# Patient Record
Sex: Male | Born: 1950 | Race: White | Hispanic: No | Marital: Married | Smoking: Former smoker
Health system: Southern US, Community
[De-identification: ages and names within clinical notes are randomized; demographics above are authoritative.]

## PROBLEM LIST (undated history)

## (undated) DIAGNOSIS — E785 Hyperlipidemia, unspecified: Secondary | ICD-10-CM

## (undated) DIAGNOSIS — C439 Malignant melanoma of skin, unspecified: Secondary | ICD-10-CM

## (undated) DIAGNOSIS — N201 Calculus of ureter: Secondary | ICD-10-CM

## (undated) DIAGNOSIS — M722 Plantar fascial fibromatosis: Secondary | ICD-10-CM

## (undated) HISTORY — DX: Hyperlipidemia, unspecified: E78.5

## (undated) HISTORY — DX: Plantar fascial fibromatosis: M72.2

## (undated) HISTORY — DX: Malignant melanoma of skin, unspecified: C43.9

## (undated) HISTORY — DX: Calculus of ureter: N20.1

---

## 1995-09-18 ENCOUNTER — Encounter: Payer: Self-pay | Admitting: Family Medicine

## 1995-09-18 LAB — CONVERTED CEMR LAB
Blood Glucose, Fasting: 90 mg/dL
RBC count: 5 10*6/uL
WBC, blood: 7.8 10*3/uL

## 1996-05-31 DIAGNOSIS — N201 Calculus of ureter: Secondary | ICD-10-CM

## 1996-05-31 HISTORY — DX: Calculus of ureter: N20.1

## 1998-03-10 ENCOUNTER — Encounter: Payer: Self-pay | Admitting: Family Medicine

## 1998-03-10 LAB — CONVERTED CEMR LAB: Blood Glucose, Fasting: 90 mg/dL

## 1999-07-29 ENCOUNTER — Encounter: Payer: Self-pay | Admitting: Family Medicine

## 1999-07-29 ENCOUNTER — Encounter: Admission: RE | Admit: 1999-07-29 | Discharge: 1999-07-29 | Payer: Self-pay | Admitting: Family Medicine

## 1999-07-30 ENCOUNTER — Encounter: Admission: RE | Admit: 1999-07-30 | Discharge: 1999-08-30 | Payer: Self-pay | Admitting: Family Medicine

## 2000-02-23 ENCOUNTER — Encounter: Payer: Self-pay | Admitting: Family Medicine

## 2000-02-23 LAB — CONVERTED CEMR LAB: Blood Glucose, Fasting: 98 mg/dL

## 2000-03-07 ENCOUNTER — Encounter: Payer: Self-pay | Admitting: Family Medicine

## 2000-03-07 LAB — CONVERTED CEMR LAB: Blood Glucose, Fasting: 77 mg/dL

## 2000-03-13 ENCOUNTER — Encounter: Payer: Self-pay | Admitting: Family Medicine

## 2000-03-13 LAB — CONVERTED CEMR LAB
RBC count: 5.2 10*6/uL
TSH: 1.51 microintl units/mL
WBC, blood: 8.3 10*3/uL

## 2003-08-18 ENCOUNTER — Encounter: Payer: Self-pay | Admitting: Family Medicine

## 2003-08-18 LAB — CONVERTED CEMR LAB
Blood Glucose, Fasting: 101 mg/dL
PSA: 0.8 ng/mL
TSH: 1.13 microintl units/mL

## 2003-10-08 LAB — HM COLONOSCOPY: HM Colonoscopy: NORMAL

## 2004-08-26 ENCOUNTER — Ambulatory Visit: Payer: Self-pay | Admitting: Family Medicine

## 2005-09-12 ENCOUNTER — Ambulatory Visit: Payer: Self-pay | Admitting: Family Medicine

## 2005-09-12 LAB — CONVERTED CEMR LAB
Blood Glucose, Fasting: 108 mg/dL
PSA: 0.72 ng/mL
TSH: 1.33 microintl units/mL

## 2005-09-14 ENCOUNTER — Ambulatory Visit: Payer: Self-pay | Admitting: Family Medicine

## 2005-10-06 ENCOUNTER — Ambulatory Visit: Payer: Self-pay | Admitting: Family Medicine

## 2007-03-28 ENCOUNTER — Encounter: Payer: Self-pay | Admitting: Family Medicine

## 2007-03-28 DIAGNOSIS — E781 Pure hyperglyceridemia: Secondary | ICD-10-CM

## 2007-03-28 DIAGNOSIS — R945 Abnormal results of liver function studies: Secondary | ICD-10-CM

## 2007-03-28 DIAGNOSIS — R7309 Other abnormal glucose: Secondary | ICD-10-CM | POA: Insufficient documentation

## 2007-03-29 ENCOUNTER — Ambulatory Visit: Payer: Self-pay | Admitting: Family Medicine

## 2007-03-29 LAB — CONVERTED CEMR LAB
ALT: 109 units/L — ABNORMAL HIGH (ref 0–53)
AST: 50 units/L — ABNORMAL HIGH (ref 0–37)
Bilirubin, Direct: 0.2 mg/dL (ref 0.0–0.3)
CO2: 32 meq/L (ref 19–32)
Calcium: 9.4 mg/dL (ref 8.4–10.5)
Chloride: 106 meq/L (ref 96–112)
Cholesterol: 178 mg/dL (ref 0–200)
Creatinine, Ser: 1.3 mg/dL (ref 0.4–1.5)
Creatinine,U: 127 mg/dL
Glucose, Bld: 100 mg/dL — ABNORMAL HIGH (ref 70–99)
HDL: 31.3 mg/dL — ABNORMAL LOW (ref >39.0)
LDL Cholesterol: 108 mg/dL — ABNORMAL HIGH (ref 0–99)
Sodium: 142 meq/L (ref 135–145)
TSH: 1.45 microintl units/mL (ref 0.35–5.50)
Total Bilirubin: 1.3 mg/dL — ABNORMAL HIGH (ref 0.3–1.2)
Total Protein: 6.8 g/dL (ref 6.0–8.3)

## 2007-04-04 ENCOUNTER — Ambulatory Visit: Payer: Self-pay | Admitting: Family Medicine

## 2007-05-01 ENCOUNTER — Ambulatory Visit: Payer: Self-pay | Admitting: Family Medicine

## 2007-05-01 LAB — CONVERTED CEMR LAB: OCCULT 3: NEGATIVE

## 2007-05-02 ENCOUNTER — Encounter (INDEPENDENT_AMBULATORY_CARE_PROVIDER_SITE_OTHER): Payer: Self-pay | Admitting: *Deleted

## 2007-08-15 ENCOUNTER — Telehealth: Payer: Self-pay | Admitting: Family Medicine

## 2008-02-01 DIAGNOSIS — C439 Malignant melanoma of skin, unspecified: Secondary | ICD-10-CM

## 2008-02-01 HISTORY — DX: Malignant melanoma of skin, unspecified: C43.9

## 2008-02-07 ENCOUNTER — Encounter: Payer: Self-pay | Admitting: Family Medicine

## 2008-02-07 HISTORY — PX: MELANOMA EXCISION: SHX5266

## 2008-04-04 ENCOUNTER — Ambulatory Visit: Payer: Self-pay | Admitting: Family Medicine

## 2008-04-06 LAB — CONVERTED CEMR LAB
Alkaline Phosphatase: 60 units/L (ref 39–117)
Basophils Absolute: 0 10*3/uL (ref 0.0–0.1)
Bilirubin, Direct: 0.1 mg/dL (ref 0.0–0.3)
CO2: 29 meq/L (ref 19–32)
Calcium: 9.5 mg/dL (ref 8.4–10.5)
Cholesterol: 175 mg/dL (ref 0–200)
Direct LDL: 105.6 mg/dL
GFR calc Af Amer: 89 mL/min
HDL: 31.4 mg/dL — ABNORMAL LOW (ref >39.0)
Hemoglobin: 16.8 g/dL (ref 13.0–17.0)
Lymphocytes Relative: 24.9 % (ref 12.0–46.0)
MCHC: 35.4 g/dL (ref 30.0–36.0)
Microalb Creat Ratio: 4.6 mg/g (ref 0.0–30.0)
Microalb, Ur: 0.8 mg/dL (ref 0.0–1.9)
Neutro Abs: 5.2 10*3/uL (ref 1.4–7.7)
PSA: 0.65 ng/mL (ref 0.10–4.00)
Platelets: 162 10*3/uL (ref 150–400)
Potassium: 4.5 meq/L (ref 3.5–5.1)
RDW: 12.4 % (ref 11.5–14.6)
Sodium: 144 meq/L (ref 135–145)
Total Bilirubin: 1.2 mg/dL (ref 0.3–1.2)
Total CHOL/HDL Ratio: 5.6
Triglycerides: 204 mg/dL (ref 0–149)
VLDL: 41 mg/dL — ABNORMAL HIGH (ref 0–40)

## 2008-04-10 ENCOUNTER — Ambulatory Visit: Payer: Self-pay | Admitting: Family Medicine

## 2008-07-17 ENCOUNTER — Encounter: Admission: RE | Admit: 2008-07-17 | Discharge: 2008-07-17 | Payer: Self-pay | Admitting: Occupational Medicine

## 2009-01-31 DIAGNOSIS — M722 Plantar fascial fibromatosis: Secondary | ICD-10-CM

## 2009-01-31 HISTORY — DX: Plantar fascial fibromatosis: M72.2

## 2009-04-07 ENCOUNTER — Ambulatory Visit: Payer: Self-pay | Admitting: Family Medicine

## 2009-04-07 LAB — CONVERTED CEMR LAB
ALT: 67 units/L — ABNORMAL HIGH (ref 0–53)
AST: 35 units/L (ref 0–37)
Albumin: 4.3 g/dL (ref 3.5–5.2)
Alkaline Phosphatase: 65 units/L (ref 39–117)
CO2: 30 meq/L (ref 19–32)
Chloride: 107 meq/L (ref 96–112)
Glucose, Bld: 100 mg/dL — ABNORMAL HIGH (ref 70–99)
Microalb Creat Ratio: 37.2 mg/g — ABNORMAL HIGH (ref 0.0–30.0)
Potassium: 4.5 meq/L (ref 3.5–5.1)
Sodium: 142 meq/L (ref 135–145)
Total Protein: 7.3 g/dL (ref 6.0–8.3)

## 2009-04-14 ENCOUNTER — Ambulatory Visit: Payer: Self-pay | Admitting: Family Medicine

## 2009-08-18 ENCOUNTER — Encounter: Payer: Self-pay | Admitting: Family Medicine

## 2009-09-08 ENCOUNTER — Encounter (INDEPENDENT_AMBULATORY_CARE_PROVIDER_SITE_OTHER): Payer: Self-pay | Admitting: *Deleted

## 2009-11-05 ENCOUNTER — Ambulatory Visit: Payer: Self-pay | Admitting: Family Medicine

## 2009-11-05 DIAGNOSIS — M542 Cervicalgia: Secondary | ICD-10-CM

## 2010-02-21 ENCOUNTER — Encounter: Payer: Self-pay | Admitting: General Surgery

## 2010-03-02 NOTE — Letter (Signed)
Summary: [facility]  [facility]   Imported By: Lanelle Bal 08/26/2009 09:17:45  _____________________________________________________________________  External Attachment:    Type:   Image     Comment:   External Document  Appended Document: [facility]    Clinical Lists Changes  Observations: Added new observation of PAST MED HX: L plantar fasciitis 2011 (08/26/2009 11:22)       Past History:  Past Medical History: L plantar fasciitis 2011

## 2010-03-02 NOTE — Assessment & Plan Note (Signed)
Summary: CPX/CLE   Vital Signs:  Patient profile:   60 year old male Height:      71 inches Weight:      228 pounds BMI:     31.91 Temp:     98.1 degrees F oral Pulse rate:   76 / minute Pulse rhythm:   regular BP sitting:   122 / 84  (left arm) Cuff size:   large  Vitals Entered By: Sydell Axon LPN (April 14, 2009 9:18 AM) CC: 30 Minute checkup, had a colonoscopy 09/05   History of Present Illness: Pt here for Comp Exam....retired last Jul and is enjoying it. He still coaches football at the high school.  He has no complaint. He had cold last week which is almost resolved. His wife teaches still and brought something home from  school. He has lost 6 pounds since last year.  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 * Tetanus  Past History:  Past Surgical History: Last updated: 04/10/2008 IVP DISTAL RIGHT URETRAL STONE :(05/1996) SCC EXCISION (DR JONES) (01/2000) ETT  WORKUP ST DOWN III :(03/2000) ABDOMINAL ULTRASOUND ; MILD INCREASED ECHODENSITY LIVER OTHERWISE  NORMAL:(08/29/2003) COLONOSCOPY --NORMAL:(DR.PATTERSON):(10/08/2003) -- REPEAT 10 YEARS MELANOMA MOHS EXCISION R ANT FLANK WITH NEG SENTINEL NODES [facility] 02/07/2008  Family History: Last updated: 04/14/2009 Father:A 84 Chol   Mother: A  84 Skin Cancers BROTHER A 61 CV: NEGATIVE HBP: NEGATIVE DM: NEGATIVE GOUT/ARTHRITIS: PROSTATE CANCER:  BREAST/OVARIAN/UTERINE CANCER: COLON CANCER: + UNCLE DEPRESSION: NEGATIVE ETOH/DRUG ABUSE: NEGATIVE OTHER: NEGATIVE STROKE  Social History: Last updated: 04/14/2009 Marital Status: Married  Lives with wife Children:3 DAUGHTERS  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[employer]  [employer]acks  Risk Factors: Alcohol Use: 0 (04/14/2009) Caffeine Use: coffee x 2 (04/14/2009) Exercise: yes (04/14/2009)  Risk Factors: Smoking Status: quit > 6 months (04/14/2009) Packs/Day: 1992 (04/14/2009) Passive Smoke Exposure: no (04/14/2009)  Family History: Father:A 84 Chol   Mother: A  84 Skin Cancers BROTHER A 61 CV: NEGATIVE HBP: NEGATIVE DM: NEGATIVE GOUT/ARTHRITIS: PROSTATE CANCER:  BREAST/OVARIAN/UTERINE CANCER: COLON CANCER: + UNCLE DEPRESSION: NEGATIVE ETOH/DRUG ABUSE: NEGATIVE OTHER: NEGATIVE STROKE  Social History: Marital Status: Married  Lives with wife Children:3 DAUGHTERS  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 Eyes:  Denies blurring, discharge, and eye pain. ENT:  Denies  decreased hearing, earache, and ringing in ears. CV:  Denies chest pain or discomfort, fainting, fatigue, palpitations, shortness of breath with exertion, swelling of feet, and swelling of hands. Resp:  Denies cough, shortness of breath, and wheezing. GI:  Denies abdominal pain, bloody stools, change in bowel habits, constipation, dark tarry stools, diarrhea,  indigestion, loss of appetite, nausea, vomiting, vomiting blood, and yellowish skin color. GU:  Denies discharge, dysuria, and urinary frequency. MS:  Complains of cramps; denies joint pain, joint redness, low back pain, muscle weakness, and stiffness; occas in summer. Derm:  Denies dryness, itching, and rash. Neuro:  Denies numbness, poor balance, tingling, and tremors.  Physical Exam  General:  Well-developed,well-nourished,in no acute distress; alert,appropriate and cooperative throughout examination. Minimally  obese. Head:  Normocephalic and atraumatic without obvious abnormalities. No apparent alopecia or balding. Eyes:  Conjunctiva clear bilaterally.  Ears:  External ear exam shows no significant lesions or deformities.  Otoscopic examination reveals clear canals, tympanic membranes are intact bilaterally without bulging, retraction, inflammation or discharge. Hearing is grossly normal bilaterally. Nose:  External nasal examination shows no deformity or inflammation. Nasal mucosa are pink and moist without lesions or exudates. Mouth:  Oral mucosa and oropharynx without lesions or exudates.  Teeth in good repair. Neck:  No deformities, masses, or tenderness noted. Chest Wall:  No deformities, masses, tenderness or gynecomastia noted. Breasts:  No masses or gynecomastia noted Lungs:  Normal respiratory effort, chest expands symmetrically. Lungs are clear to auscultation, no crackles or wheezes. Heart:  Normal rate and regular rhythm. S1 and S2 normal without gallop, murmur, click, rub or other extra sounds. Abdomen:  Bowel sounds positive,abdomen soft and non-tender without masses, organomegaly or hernias noted. Protuberant. Rectal:  No external abnormalities noted. Normal sphincter tone. No rectal masses or tenderness. G neg. Genitalia:  Testes bilaterally descended without nodularity, tenderness or masses. No scrotal masses or lesions. No penis lesions or urethral discharge. Prostate:   Prostate gland firm and smooth, no enlargement, nodularity, tenderness, mass, asymmetry or induration. 10-20gms. Msk:  No deformity or scoliosis noted of thoracic or lumbar spine.   Pulses:  R and L carotid,radial,femoral,dorsalis pedis and posterior tibial pulses are full and equal bilaterally Extremities:  No clubbing, cyanosis, edema, or deformity noted with normal full range of motion of all joints.   Neurologic:  No cranial nerve deficits noted. Station and gait are normal. Sensory, motor and coordinative functions appear intact. Skin:  Intact without suspicious lesions or rashes. Multip healed surg scars. Cervical Nodes:  No lymphadenopathy noted Inguinal Nodes:  No significant adenopathy Psych:  Cognition and judgment appear intact. Alert and cooperative with normal attention span and concentration. No apparent delusions, illusions, hallucinations   Impression & Recommendations:  Problem # 1:  HEALTH MAINTENANCE EXAM (ICD-V70.0) Assessment Comment Only  Problem # 2:  MELANOMA, RIGHT FLANK (ICD-172.9) Assessment: Unchanged Stable, see Derm every 6 mos.  Problem # 3:  HYPERGLYCEMIA (ICD-790.29) Assessment: Improved  Better than last year. Increased activity has helped. Cont.  Labs Reviewed: Creat: 1.2 (04/07/2009)     Problem # 4:  LIVER FUNCTION TESTS, ABNORMAL (ICD-794.8) Assessment: Improved  Only one slightly elevated this year. Chol control with exercise and weight loss has obviously helped.  Problem # 5:  HYPERTRIGLYCERIDEMIA, MILD (ICD-272.4) Assessment: Improved Cont to avoid sweets and carbs....the latter his problem. Labs Reviewed: SGOT: 35 (04/07/2009)   SGPT: 67 (04/07/2009)   HDL:40.50 (04/07/2009), 31.4 (04/04/2008)  LDL:DEL (04/04/2008), 108 (03/29/2007)  Chol:191 (04/07/2009), 175 (04/04/2008)  Trig:249.0 (04/07/2009),  204 (04/04/2008)  Patient Instructions: 1)  RTC one year.  Prior Medications (reviewed today): None Current Allergies (reviewed  today): ! * TETANUS

## 2010-03-02 NOTE — Letter (Signed)
Summary: Nadara Eaton letter  [place] [facility]  [address] [place], [place] [address]   Phone: [phone]  Fax: [phone]       09/08/2009 MRN: [id number]  [facility] [address] [place], [place]  [address]  Dear Mr. Thayer,  [facility] and [place] announce the retirement of Arta Silence, M.D., from full-time practice at the [facility] office effective July 30, 2009 and his plans of returning part-time.  It is important to Dr. Hetty Ely and to our practice that you understand that [facility] has seven physicians in our office for your health care needs.  We will continue to offer the same exceptional care that you have today.    Dr. Hetty Ely has spoken to many of you about his plans for retirement and returning part-time in the fall.   We will continue to work with you through the transition to schedule appointments for you in the office and meet the high standards that [facility] is committed to.   Again, it is with great pleasure that we share the news that Dr. Hetty Ely will return to [facility] in October of 2011 with a reduced schedule.    If you have any questions, or would like to request an appointment with one of our physicians, please call us at [phone] and press the option for Scheduling an appointment.  We take pleasure in providing you with excellent patient care and look forward to seeing you at your next office visit.  Our [facility] Physicians are:  Tillman Abide, M.D. Laurita Quint, M.D. Roxy Manns, M.D. Kerby Nora, M.D. Hannah Beat, M.D. Ruthe Mannan, M.D. We proudly welcomed Raechel Ache, M.D. and Eustaquio Boyden, M.D. to the practice in July/August 2011.  Sincerely,   [facility]

## 2010-03-02 NOTE — Assessment & Plan Note (Signed)
Summary: NECK PAIN/CLE   Vital Signs:  Patient profile:   60 year old male Height:      71 inches Weight:      228.0 pounds BMI:     31.91 Temp:     98.7 degrees F oral Pulse rate:   76 / minute Pulse rhythm:   regular BP sitting:   110 / 82  (left arm) Cuff size:   large  Vitals Entered By: Benny Lennert CMA Duncan Dull) (November 05, 2009 11:05 AM)  History of Present Illness: Chief complaint Neck pain  60 year old male:  about two weeks ago, felt a little pull a couple weeks ago  no prior nek prbs weekend  had his his twenty pound grandson.   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  She has used intermittent ibuprofen, has gotten somewhat better, though it aggravated a couple days he goes well. No numbness or tingling. No prior history of significant neck injury or operative intervention.   Nerve function, muscular strength is intact.  Allergies: 1)  ! * Tetanus  Past History:  Past medical, surgical, family and social histories (including risk factors) reviewed, and no changes noted (except as noted below).  Past Medical History: Reviewed history from 08/26/2009 and no changes required. L plantar fasciitis 2011  Past Surgical History: Reviewed history from 04/10/2008 and no changes required. IVP DISTAL RIGHT URETRAL STONE :(05/1996) SCC EXCISION (DR JONES) (01/2000) ETT  WORKUP ST DOWN III :(03/2000) ABDOMINAL ULTRASOUND ; MILD INCREASED ECHODENSITY LIVER OTHERWISE NORMAL:(08/29/2003) COLONOSCOPY --NORMAL:(DR.PATTERSON):(10/08/2003) -- REPEAT 10 YEARS MELANOMA MOHS EXCISION R ANT FLANK WITH NEG SENTINEL NODES [facility] 02/07/2008  Family History: Reviewed history from 04/14/2009 and no changes required. Father:A 84 Chol   Mother: A  84 Skin Cancers BROTHER A 61 CV: NEGATIVE HBP: NEGATIVE DM: NEGATIVE GOUT/ARTHRITIS: PROSTATE CANCER:  BREAST/OVARIAN/UTERINE CANCER: COLON CANCER: +  UNCLE DEPRESSION: NEGATIVE ETOH/DRUG ABUSE: NEGATIVE OTHER: NEGATIVE STROKE  Social History: Reviewed history from 04/14/2009 and no changes required. 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 Otherwise the pertinent positives of the ROS are noted above.    Physical Exam  General:  GEN: Well-developed,well-nourished,in no acute distress; alert,appropriate and cooperative throughout examination HEENT: Normocephalic and atraumatic without obvious abnormalities. No apparent alopecia or balding. Ears, externally no deformities PULM: Breathing comfortably in no respiratory distress EXT: No clubbing, cyanosis, or edema PSYCH: Normally interactive. Cooperative during the interview. Pleasant. Friendly and conversant. Not anxious or depressed appearing. Normal, full affect.  Msk:  nontender in the cervical spine, spinous process area.  Mild to moderate tenderness in the paracervical musculature.  Mildly limited in flexion and extension as well as lateral bending. Rotational motions are more painful and limited, notably  looking to the LEFT.  Negative Spurling's examination.  C5-T1 are intact.   Impression & Recommendations:  Problem # 1:  NECK PAIN, ACUTE (ICD-723.1) Assessment New most consistent with acute neck  strain.  Moist heat, massage, range of motion  in the shower,  NSAIDs, Zanaflex at night. Take some care during football practice to not reaggravate.  If not improving in 3-4 weeks, reconsideration of etiology appropriate.  His updated medication list for this problem includes:    Tizanidine Hcl 4 Mg Tabs  (Tizanidine hcl) .Marland Kitchen... 1 by mouth at bedtime    Diclofenac Sodium 75 Mg Tbec (Diclofenac sodium) .Marland Kitchen... 1 by mouth bid .take with food  Complete Medication List: 1)  Tizanidine Hcl 4 Mg Tabs (Tizanidine hcl) .Marland Kitchen.. 1 by mouth at bedtime 2)  Diclofenac Sodium 75 Mg Tbec (Diclofenac sodium) .Marland Kitchen.. 1 by mouth bid .take with food Prescriptions: TIZANIDINE HCL 4 MG TABS (TIZANIDINE HCL) 1 by mouth at bedtime  #30 x 2   Entered and Authorized by:   Hannah Beat MD   Signed by:   Hannah Beat MD on 11/05/2009   Method used:   Electronically to        [facility]  [address] [phone]* (retail)       [address]       [place], [place]  [address]       Ph: [phone]       Fax: [phone]   RxID:   [id number] DICLOFENAC SODIUM 75 MG TBEC (DICLOFENAC SODIUM) 1 by mouth bid .take with food  #60 x 1   Entered and Authorized by:   Hannah Beat MD   Signed by:   Hannah Beat MD on 11/05/2009   Method used:   [facility]        [facility]  [address] [phone]* (retail)       [address]       [place], [place]  [address]       Ph: [phone]       Fax: [phone]   RxID:   [id number] TIZANIDINE HCL 4 MG TABS (TIZANIDINE HCL) 1 by mouth at bedtime  #30 x 2   Entered and Authorized by:   Hannah Beat MD   Signed by:   Hannah Beat MD on 11/05/2009   Method used:   Print then Give to Patient   RxID:   [id number]   Prior Medications (reviewed today): None Current Allergies (reviewed today): ! * TETANUS

## 2010-03-06 ENCOUNTER — Encounter: Payer: Self-pay | Admitting: Family Medicine

## 2010-04-19 ENCOUNTER — Encounter: Payer: Self-pay | Admitting: *Deleted

## 2010-04-19 ENCOUNTER — Other Ambulatory Visit: Payer: Self-pay | Admitting: Family Medicine

## 2010-04-19 ENCOUNTER — Other Ambulatory Visit (INDEPENDENT_AMBULATORY_CARE_PROVIDER_SITE_OTHER): Payer: 59

## 2010-04-19 DIAGNOSIS — R7309 Other abnormal glucose: Secondary | ICD-10-CM

## 2010-04-19 DIAGNOSIS — Z0000 Encounter for general adult medical examination without abnormal findings: Secondary | ICD-10-CM

## 2010-04-19 DIAGNOSIS — R945 Abnormal results of liver function studies: Secondary | ICD-10-CM

## 2010-04-19 DIAGNOSIS — Z125 Encounter for screening for malignant neoplasm of prostate: Secondary | ICD-10-CM

## 2010-04-19 DIAGNOSIS — E785 Hyperlipidemia, unspecified: Secondary | ICD-10-CM

## 2010-04-19 LAB — BASIC METABOLIC PANEL
BUN: 20 mg/dL (ref 6–23)
CO2: 27 mEq/L (ref 19–32)
Chloride: 107 mEq/L (ref 96–112)
Glucose, Bld: 108 mg/dL — ABNORMAL HIGH (ref 70–99)
Potassium: 4.2 mEq/L (ref 3.5–5.1)

## 2010-04-19 LAB — LIPID PANEL
Cholesterol: 157 mg/dL (ref 0–200)
HDL: 35 mg/dL — ABNORMAL LOW (ref >39.00)
LDL Cholesterol: 87 mg/dL (ref 0–99)
Triglycerides: 174 mg/dL — ABNORMAL HIGH (ref 0.0–149.0)
VLDL: 34.8 mg/dL (ref 0.0–40.0)

## 2010-04-19 LAB — HEPATIC FUNCTION PANEL
ALT: 44 U/L (ref 0–53)
AST: 26 U/L (ref 0–37)
Albumin: 4.5 g/dL (ref 3.5–5.2)
Alkaline Phosphatase: 72 U/L (ref 39–117)

## 2010-04-19 LAB — TSH: TSH: 1.55 u[IU]/mL (ref 0.35–5.50)

## 2010-04-21 ENCOUNTER — Encounter: Payer: Self-pay | Admitting: Family Medicine

## 2010-04-21 ENCOUNTER — Ambulatory Visit (INDEPENDENT_AMBULATORY_CARE_PROVIDER_SITE_OTHER): Payer: 59 | Admitting: Family Medicine

## 2010-04-21 VITALS — BP 112/74 | HR 84 | Temp 98.1°F | Ht 71.0 in | Wt 206.0 lb

## 2010-04-21 DIAGNOSIS — R945 Abnormal results of liver function studies: Secondary | ICD-10-CM

## 2010-04-21 DIAGNOSIS — M542 Cervicalgia: Secondary | ICD-10-CM

## 2010-04-21 DIAGNOSIS — Z0000 Encounter for general adult medical examination without abnormal findings: Secondary | ICD-10-CM

## 2010-04-21 DIAGNOSIS — E785 Hyperlipidemia, unspecified: Secondary | ICD-10-CM

## 2010-04-21 DIAGNOSIS — R7309 Other abnormal glucose: Secondary | ICD-10-CM

## 2010-04-21 NOTE — Assessment & Plan Note (Addendum)
Still slightly elevated. Cont low carb diet and try to get to applying this 7 days a week.

## 2010-04-21 NOTE — Assessment & Plan Note (Signed)
Improved. Discussed approach for acute pain.

## 2010-04-21 NOTE — Assessment & Plan Note (Signed)
Lab Results  Component Value Date   CHOL 157 04/19/2010   CHOL 191 04/07/2009   CHOL 175 04/04/2008   Lab Results  Component Value Date   HDL 35.00* 04/19/2010   HDL 40.50 04/07/2009   HDL 31.4* 04/04/2008   Lab Results  Component Value Date   LDLCALC 87 04/19/2010   LDLCALC 108* 03/29/2007   Lab Results  Component Value Date   TRIG 174.0* 04/19/2010   TRIG 249.0* 04/07/2009   TRIG 204* 04/04/2008   Lab Results  Component Value Date   CHOLHDL 4 04/19/2010   CHOLHDL 5 04/07/2009   CHOLHDL 5.6 CALC 04/04/2008   Still slightly high but improved. Cont off meds.

## 2010-04-21 NOTE — Assessment & Plan Note (Signed)
Lab Results  Component Value Date   ALT 44 04/19/2010   AST 26 04/19/2010   ALKPHOS 72 04/19/2010   BILITOT 0.8 04/19/2010   Now normalized. Weight loss has greatly made a difference.

## 2010-04-21 NOTE — Progress Notes (Signed)
  Subjective:    Patient ID: Douglas Gaines, male    DOB: [birth year], 60 y.o.   MRN: [id number]  HPI Pt here for Comp Exam. He feels well and has no complaints. He had a crick in his neck a while back in his neck and uses ZANAFLEX  And still has some thzt he can use as needed.   Review of Systems  Constitutional: Negative for fever, chills, diaphoresis, activity change, appetite change, fatigue and unexpected weight change.  HENT: Negative for hearing loss, ear pain, nosebleeds, congestion, sneezing, trouble swallowing, neck pain, neck stiffness and tinnitus.   Eyes: Negative for pain, discharge and visual disturbance.  Respiratory: Negative for cough, chest tightness, shortness of breath and wheezing.   Cardiovascular: Negative for chest pain, palpitations and leg swelling.  Gastrointestinal: Negative for nausea, vomiting, abdominal pain, diarrhea, constipation, blood in stool and abdominal distention.  Genitourinary: Negative for dysuria and difficulty urinating.  Musculoskeletal: Negative for myalgias, back pain and joint swelling.  Skin: Negative for rash.  Neurological: Negative for dizziness, tremors, syncope, weakness and numbness.  Hematological: Negative for adenopathy. Bruises/bleeds easily.  Psychiatric/Behavioral: Negative for sleep disturbance, dysphoric mood and agitation.       Objective:   Physical Exam  Constitutional: He is oriented to person, place, and time. He appears well-developed and well-nourished. No distress.  HENT:  Head: Normocephalic and atraumatic.  Right Ear: External ear normal.  Left Ear: External ear normal.  Nose: Nose normal.  Mouth/Throat: Oropharynx is clear and moist.       Cerumen in left ear.  Eyes: Conjunctivae and EOM are normal. Pupils are equal, round, and reactive to light. Right eye exhibits no discharge. Left eye exhibits no discharge. No scleral icterus.  Neck: Normal range of motion. Neck supple. No thyromegaly present.    Cardiovascular: Normal rate, regular rhythm, normal heart sounds and intact distal pulses.   No murmur heard. Pulmonary/Chest: Effort normal and breath sounds normal. He has no wheezes.  Abdominal: Soft. Bowel sounds are normal. He exhibits no distension and no mass. There is no tenderness.  Genitourinary: Rectum normal, prostate normal and penis normal. Guaiac negative stool.  Musculoskeletal: Normal range of motion. He exhibits no edema.  Lymphadenopathy:    He has no cervical adenopathy.  Neurological: He is alert and oriented to person, place, and time.  Skin: Skin is warm and dry. No rash noted. He is not diaphoretic. No erythema.  Psychiatric: He has a normal mood and affect. His behavior is normal. Judgment and thought content normal.          Assessment & Plan:  Annual Physical Exam Discussed continued low carb diet and regular exercise. He watches carefully 4 days a week and loosens up the other three days and has lost 22 pounds doing this. His  Challenge is to continue the diet control to eventually extend to all 7 days of the week. Otherwise cont his current lifestyle.

## 2010-07-27 ENCOUNTER — Ambulatory Visit: Payer: 59 | Admitting: Family Medicine

## 2010-07-27 ENCOUNTER — Encounter (INDEPENDENT_AMBULATORY_CARE_PROVIDER_SITE_OTHER): Payer: Self-pay | Admitting: General Surgery

## 2010-07-27 ENCOUNTER — Telehealth: Payer: Self-pay | Admitting: *Deleted

## 2010-07-27 NOTE — Telephone Encounter (Signed)
Patient advised.

## 2010-07-27 NOTE — Telephone Encounter (Signed)
Noted.  If he is SOB, having CP, or feeling faint, the he needs to be at ER in meantime.  Please notify pt.  Thanks.

## 2010-07-27 NOTE — Telephone Encounter (Signed)
Patient was on a cruise all last week and on Thursday he got a hemorrhoid and he says that it is about the size of a golf ball. He was scheduled to see a surgeon today, but they canceled his appt.  He says that he is losing a lot of blood with bowel movement and when he wipes. He says that he has been really light headed on and off since a couple of days ago, but he isn't sure if it is from the blood loss or if  it is possibly from being on the cruise. He was asking if he could come in for a cbc, but per Dr. Sharen Hones he would need to be seen first. We have no openings here today with anyone, so I advised Urgent Care. Patient says that he already has an appt with the surgeon for tomorrow, but also scheduled appt to see you tomorrow in case the surgeon cancels again. He will cancel appt here if it is not needed.

## 2010-07-28 ENCOUNTER — Ambulatory Visit: Payer: 59 | Admitting: Family Medicine

## 2010-07-28 ENCOUNTER — Ambulatory Visit (INDEPENDENT_AMBULATORY_CARE_PROVIDER_SITE_OTHER): Payer: 59 | Admitting: General Surgery

## 2010-07-28 ENCOUNTER — Encounter (INDEPENDENT_AMBULATORY_CARE_PROVIDER_SITE_OTHER): Payer: Self-pay | Admitting: General Surgery

## 2010-07-28 VITALS — BP 124/78 | HR 104 | Temp 97.8°F | Wt 195.0 lb

## 2010-07-28 DIAGNOSIS — K645 Perianal venous thrombosis: Secondary | ICD-10-CM | POA: Insufficient documentation

## 2010-07-28 MED ORDER — OXYCODONE-ACETAMINOPHEN 5-325 MG PO TABS: 1.0000 | ORAL_TABLET | ORAL | Status: DC | PRN

## 2010-07-28 NOTE — Assessment & Plan Note (Signed)
The thrombosed hemorrhoid was incised and evacuated as described above. The patient was advised to take it easy for the next several days. He was given a prescription for 20 tablets of Percocet. He is scheduled to go to [place] in the next week. He is advised that he will need to take pain medication for the trip most likely. He was advised that he should do sitz baths twice a day. He has whirlpool tub at home.

## 2010-07-28 NOTE — Progress Notes (Signed)
Subjective:     Patient ID: Douglas Gaines, male   DOB: 1950-12-25, 60 y.o.   MRN: [id number]    BP 124/78  Pulse 104  Temp 97.8 F (36.6 C)  Wt 195 lb (88.451 kg)    HPI Patient is a 60 year old male who presents with around 6 days of perirectal pain. He was on a cruise last week and experienced swelling and severe discomfort. He went to see the cruise doctor and was informed that he had a thrombosed hemorrhoid. He did not want down ago and incision on it or in [place] as he wanted to wait until he returned home. He has been placing hemorrhoid cream on the swelling. He has not been doing for the past. He has been taking stool softeners. He has not been straining to have bowel movements that has been having softer stools now. He has experienced some drainage from the hemorrhoid over the last few days. He says he has had occasional hemorrhoids on-and-off in the past but these are such that he has only experienced discomfort for around a day or 2.   Review of Systems Review of systems is otherwise negative    Objective:   Physical Exam General: Alert and oriented x3 no acute distress HEENT: Normocephalic atraumatic sclerae anicteric neck no lymphadenopathy no thyromegaly. Psychiatry: Mood and affect are normal Gait is normal no significant musculoskeletal deformities Rectal: There is a large thrombosed hemorrhoid on the right with a few small openings and clotted blood coming out. This was around 3 cm in diameter.  Procedure The hemorrhoid was cleaned with a Betadine swab. Local anesthetic in the form of 1% lidocaine and epinephrine and bicarbonate solution were injected in the area around the hemorrhoid. Once the area was anesthetized the 2 openings were connected with a #11 blade. Copious amounts of clot were expressed. There was no significant bleeding from the wound. The wound was dressed with dry gauze.    Assessment:        Plan:

## 2010-08-27 ENCOUNTER — Ambulatory Visit (INDEPENDENT_AMBULATORY_CARE_PROVIDER_SITE_OTHER): Payer: 59 | Admitting: General Surgery

## 2010-08-27 VITALS — Temp 97.8°F

## 2010-08-27 DIAGNOSIS — K645 Perianal venous thrombosis: Secondary | ICD-10-CM

## 2010-08-27 NOTE — Assessment & Plan Note (Signed)
Doing better Given hemorrhoid booklet. Follow up if symptoms recur.

## 2010-08-27 NOTE — Progress Notes (Signed)
Douglas Gaines is a 60 y.o. male.    Chief Complaint  Patient presents with  . Follow-up    throm hem    HPI HPI Pt is feeling much better since the I&D of thrombosed external hemorrhoid.  He is not experiencing pain or difficulty with cleaning.  He denies bleeding.  His stools are soft.  He has been doing sitz baths as needed.   Past Medical History  Diagnosis Date  . Plantar fasciitis 2011    left  . Ureteral stone 05/98    IVP Distal Right    Past Surgical History  Procedure Date  . Melanoma excision 02/07/08    [facility] Right Ant Flank with Neg Sentinel Nodes [facility]    Family History  Problem Relation Age of Onset  . Cancer Mother     Skin  . Hyperlipidemia Father     Social History History  Substance Use Topics  . Smoking status: Former Games developer  . Smokeless tobacco: Never Used  . Alcohol Use: No    Allergies  Allergen Reactions  . Tetanus Toxoid     REACTION: UNSPECIFIED  did ok with half doses.    Current Outpatient Prescriptions  Medication Sig Dispense Refill  . oxyCODONE-acetaminophen (ROXICET) 5-325 MG per tablet Take 1 tablet by mouth every 4 (four) hours as needed for pain.  20 tablet  0  . tiZANidine (ZANAFLEX) 4 MG tablet Take 4 mg by mouth at bedtime.          Review of Systems Review of Systems    Physical Exam Physical Exam  Perianal exam demonstrates some external hemorrhoids.  No acute thrombosis.  Improving appearance. No bleeding or irritation.  Temperature 97.8 F (36.6 C).  Assessment/Plan Thrombosed external hemorrhoid Doing better Given hemorrhoid booklet. Follow up if symptoms recur.        Akeem Heppler 08/27/2010, 12:18 PM

## 2010-09-27 ENCOUNTER — Other Ambulatory Visit (INDEPENDENT_AMBULATORY_CARE_PROVIDER_SITE_OTHER): Payer: Self-pay

## 2010-09-27 NOTE — Telephone Encounter (Signed)
Pt called requested an rx for his hemorrhoids, spoke with Dr. Donell Beers she ordered Anusol HC 2% cream, called in to [facility] [phone]

## 2011-03-16 ENCOUNTER — Telehealth: Payer: Self-pay | Admitting: Family Medicine

## 2011-03-16 NOTE — Telephone Encounter (Signed)
Pt is on schedule for tomorrow

## 2011-03-16 NOTE — Telephone Encounter (Signed)
Triage Record Num: [id number] Operator: Aundra Millet Patient Name: Douglas Gaines Call Date & Time: 03/16/2011 8:41:23AM Patient Phone: [phone] PCP: Crawford Givens Patient Gender: Male PCP Fax : Patient DOB: 1950-09-03 Practice Name: [facility] Reason for Call: Caller: [name]/Patient; PCP: Crawford Givens [name]); CB#: [phone]; ; ; Call regarding Flu Like Symptoms; Onset sore throat, cough, congestion on 03/07/2011 - Sx's seemed to improve over the weekend but since last night sore throat , nasal congestion , cough returned. No fever. Cough is more bothersome at night and has to elevate head. Feels like has drainage back of throat. RN reached See in 24 hrs for cough and having to sleep with head of bed elevated per Cough protocol - No open EPIC appts today. RN called office and spoke with "Aram Beecham" who will call pt back about workin appt and pt advised Protocol(s) Used: Cough - Adult Recommended Outcome per Protocol: See Provider within 24 hours Reason for Outcome: Gradual onset of cough when lying down AND having to sleep on more than one pillow or with head of bed elevated Care Advice: ~ Avoid any activity that produces symptoms until evaluated by provider. ~ Sleep with head elevated on several pillows or in a recliner. Provide for rest periods by spacing activities that require physical exertion. Promote calm, restful environment; anxiety increases breathing difficulty. ~ ~ SYMPTOM / CONDITION MANAGEMENT ~ Call EMS 911 if having chest pain lasting more than 5 minutes, fainting, or rapid or irregular heart beat. 03/16/2011 8:58:16AM Page 1 of 1 [email or web address]

## 2011-03-17 ENCOUNTER — Ambulatory Visit (INDEPENDENT_AMBULATORY_CARE_PROVIDER_SITE_OTHER): Payer: 59 | Admitting: Family Medicine

## 2011-03-17 ENCOUNTER — Encounter: Payer: Self-pay | Admitting: Family Medicine

## 2011-03-17 VITALS — BP 102/60 | HR 74 | Temp 97.9°F | Wt 203.8 lb

## 2011-03-17 DIAGNOSIS — J029 Acute pharyngitis, unspecified: Secondary | ICD-10-CM

## 2011-03-17 DIAGNOSIS — J329 Chronic sinusitis, unspecified: Secondary | ICD-10-CM

## 2011-03-17 MED ORDER — AMOXICILLIN 875 MG PO TABS: 875.0000 mg | ORAL_TABLET | Freq: Two times a day (BID) | ORAL | Status: AC

## 2011-03-17 NOTE — Progress Notes (Signed)
duration of symptoms: 10 days Rhinorrhea: yes Congestion: yes ear pain: no, but stuffy/pressure sore throat: yes Cough: yes Myalgias: no other concerns: fever at night Was feeling better until early this week and then he got worse, now with discolored and blood rhinorrhea RST neg.  Had a flu shot.   ROS: See HPI.  Otherwise negative.    Meds, vitals, and allergies reviewed.   GEN: nad, alert and oriented HEENT: mucous membranes moist, TM w/o erythema, nasal epithelium injected, OP with cobblestoning, max sinus ttp x2, purulent discharge from nares NECK: supple w/o LA CV: rrr. PULM: ctab, no inc wob EXT: no edema

## 2011-03-17 NOTE — Patient Instructions (Signed)
Drink plenty of fluids, take tylenol as needed, and use nasal saline.  Start the antibiotics today.  This should gradually improve.  Take care.  Let us know if you have other concerns.

## 2011-03-20 DIAGNOSIS — J329 Chronic sinusitis, unspecified: Secondary | ICD-10-CM | POA: Insufficient documentation

## 2011-03-20 NOTE — Assessment & Plan Note (Signed)
Nontoxic, start amoxil given the exam and duration.  Supportive tx and fu prn.  He agrees.

## 2011-04-12 ENCOUNTER — Other Ambulatory Visit: Payer: Self-pay | Admitting: Family Medicine

## 2011-04-12 DIAGNOSIS — E781 Pure hyperglyceridemia: Secondary | ICD-10-CM

## 2011-04-15 ENCOUNTER — Other Ambulatory Visit (INDEPENDENT_AMBULATORY_CARE_PROVIDER_SITE_OTHER): Payer: 59

## 2011-04-15 DIAGNOSIS — E781 Pure hyperglyceridemia: Secondary | ICD-10-CM

## 2011-04-15 LAB — LIPID PANEL
LDL Cholesterol: 99 mg/dL (ref 0–99)
Total CHOL/HDL Ratio: 4

## 2011-04-15 LAB — COMPREHENSIVE METABOLIC PANEL
ALT: 35 U/L (ref 0–53)
AST: 25 U/L (ref 0–37)
Albumin: 4.1 g/dL (ref 3.5–5.2)
CO2: 28 mEq/L (ref 19–32)
Calcium: 9.3 mg/dL (ref 8.4–10.5)
Chloride: 106 mEq/L (ref 96–112)
Potassium: 4.6 mEq/L (ref 3.5–5.1)
Total Protein: 7.3 g/dL (ref 6.0–8.3)

## 2011-04-22 ENCOUNTER — Encounter: Payer: Self-pay | Admitting: Family Medicine

## 2011-04-22 ENCOUNTER — Ambulatory Visit (INDEPENDENT_AMBULATORY_CARE_PROVIDER_SITE_OTHER): Payer: 59 | Admitting: Family Medicine

## 2011-04-22 VITALS — BP 118/62 | HR 54 | Temp 97.4°F | Wt 201.0 lb

## 2011-04-22 DIAGNOSIS — R7309 Other abnormal glucose: Secondary | ICD-10-CM

## 2011-04-22 DIAGNOSIS — Z7189 Other specified counseling: Secondary | ICD-10-CM

## 2011-04-22 DIAGNOSIS — R945 Abnormal results of liver function studies: Secondary | ICD-10-CM

## 2011-04-22 DIAGNOSIS — Z0000 Encounter for general adult medical examination without abnormal findings: Secondary | ICD-10-CM

## 2011-04-22 NOTE — Progress Notes (Signed)
CPE- See plan.  Routine anticipatory guidance given to patient.  See health maintenance. Feeling well.   No FH prostate or colon cancer.    PMH and SH reviewed  Meds, vitals, and allergies reviewed.   ROS: See HPI.  Otherwise negative.    GEN: nad, alert and oriented HEENT: mucous membranes moist NECK: supple w/o LA CV: rrr. PULM: ctab, no inc wob ABD: soft, +bs EXT: no edema SKIN: no acute rash Prostate gland firm and smooth, no enlargement, nodularity, tenderness, mass, asymmetry or induration. Stool heme neg.

## 2011-04-22 NOTE — Patient Instructions (Addendum)
Take care.  Keep exercising.  I would get a flu shot each fall.  Recheck labs next year.  Glad to see you.

## 2011-04-22 NOTE — Assessment & Plan Note (Signed)
H/o fatty liver on u/s prev, LFTs normalized with weight loss.

## 2011-04-25 DIAGNOSIS — Z7189 Other specified counseling: Secondary | ICD-10-CM | POA: Insufficient documentation

## 2011-04-25 DIAGNOSIS — Z0000 Encounter for general adult medical examination without abnormal findings: Secondary | ICD-10-CM | POA: Insufficient documentation

## 2011-04-25 NOTE — Assessment & Plan Note (Signed)
Mild, d/w pt, continue diet and exercise.

## 2011-04-25 NOTE — Assessment & Plan Note (Signed)
Flu prev done. I wouldn't get tetanus given his prev reaction PNA and shingles shot in 60s.  Colon up to date, heme neg today PSA options were discussed along with recent recs.  No indication for psa at this point, since patient is low risk and there is no FH of prosate CA.  He declined testing of PSA. Healthy habits encouraged.

## 2012-04-11 ENCOUNTER — Other Ambulatory Visit: Payer: Self-pay | Admitting: Family Medicine

## 2012-04-11 DIAGNOSIS — R739 Hyperglycemia, unspecified: Secondary | ICD-10-CM

## 2012-04-16 ENCOUNTER — Other Ambulatory Visit (INDEPENDENT_AMBULATORY_CARE_PROVIDER_SITE_OTHER): Payer: 59

## 2012-04-16 DIAGNOSIS — R7309 Other abnormal glucose: Secondary | ICD-10-CM

## 2012-04-16 DIAGNOSIS — R739 Hyperglycemia, unspecified: Secondary | ICD-10-CM

## 2012-04-16 LAB — COMPREHENSIVE METABOLIC PANEL
ALT: 45 U/L (ref 0–53)
Albumin: 4.1 g/dL (ref 3.5–5.2)
CO2: 26 mEq/L (ref 19–32)
Calcium: 8.9 mg/dL (ref 8.4–10.5)
Chloride: 106 mEq/L (ref 96–112)
GFR: 70.44 mL/min (ref >60.00)
Potassium: 4.2 mEq/L (ref 3.5–5.1)
Sodium: 139 mEq/L (ref 135–145)
Total Bilirubin: 0.6 mg/dL (ref 0.3–1.2)
Total Protein: 6.7 g/dL (ref 6.0–8.3)

## 2012-04-16 LAB — LIPID PANEL: Total CHOL/HDL Ratio: 5

## 2012-04-23 ENCOUNTER — Ambulatory Visit (INDEPENDENT_AMBULATORY_CARE_PROVIDER_SITE_OTHER): Payer: 59 | Admitting: Family Medicine

## 2012-04-23 ENCOUNTER — Encounter: Payer: Self-pay | Admitting: Family Medicine

## 2012-04-23 VITALS — BP 122/68 | HR 64 | Temp 98.1°F | Ht 71.0 in | Wt 210.0 lb

## 2012-04-23 DIAGNOSIS — R7309 Other abnormal glucose: Secondary | ICD-10-CM

## 2012-04-23 DIAGNOSIS — Z0000 Encounter for general adult medical examination without abnormal findings: Secondary | ICD-10-CM

## 2012-04-23 NOTE — Progress Notes (Signed)
CPE- See plan.  Routine anticipatory guidance given to patient.  See health maintenance. Tetanus shot not indicated, discussed.   Flu shot done at work, yearly. Shingles shot d/w pt.  Due at 62.   Colonoscopy done 2005. Not due until 2015.  Prostate cancer screening and PSA options (with potential risks and benefits of testing vs not testing) were discussed along with recent recs/guidelines.  He declined testing PSA at this point.   Living will d/w pt. Wife would be designated if incapacitated.  He would want his children involved in decisions.   Diet and exercise d/w pt.  Doing well with both.   His brother was dx'd with cancer and the pt is trying to work through this.    PMH and SH reviewed  Meds, vitals, and allergies reviewed.   ROS: See HPI.  Otherwise negative.    GEN: nad, alert and oriented HEENT: mucous membranes moist NECK: supple w/o LA CV: rrr. PULM: ctab, no inc wob ABD: soft, +bs EXT: no edema SKIN: no acute rash

## 2012-04-23 NOTE — Assessment & Plan Note (Signed)
D/w pt about diet and exercise.  Recheck periodically.

## 2012-04-23 NOTE — Assessment & Plan Note (Signed)
Routine anticipatory guidance given to patient.  See health maintenance. Tetanus shot not indicated, discussed.   Flu shot done at work, yearly. Shingles shot d/w pt.  Due at 62.   Colonoscopy done 2005. Not due until 2015.  Prostate cancer screening and PSA options (with potential risks and benefits of testing vs not testing) were discussed along with recent recs/guidelines.  He declined testing PSA at this point.   Living will d/w pt. Wife would be designated if incapacitated.  He would want his children involved in decisions.   Diet and exercise d/w pt.  Doing well with both.

## 2012-04-23 NOTE — Patient Instructions (Addendum)
Check with your insurance to see if they will cover the shingles shot when you turn 62. Take care.  Keep exercising and stay away from sweets.   Call or come back about concerns.  Glad to see you.  I would get a flu shot each fall.

## 2013-03-11 ENCOUNTER — Ambulatory Visit (INDEPENDENT_AMBULATORY_CARE_PROVIDER_SITE_OTHER): Payer: 59 | Admitting: Family Medicine

## 2013-03-11 ENCOUNTER — Encounter: Payer: Self-pay | Admitting: Family Medicine

## 2013-03-11 VITALS — BP 122/82 | HR 84 | Temp 97.6°F | Wt 215.8 lb

## 2013-03-11 DIAGNOSIS — L989 Disorder of the skin and subcutaneous tissue, unspecified: Secondary | ICD-10-CM

## 2013-03-11 NOTE — Patient Instructions (Signed)
Let me call the derm clinic and we'll go from there.

## 2013-03-11 NOTE — Progress Notes (Signed)
Pre-visit discussion using our clinic review tool. No additional management support is needed unless otherwise documented below in the visit note.  Lump near the R axilla.  Not sore.  Known to be present for about 3 days.  Unknown of present longer than that.  No other new lumps.  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 No FCNAV. He feels well.   H/o melanoma 5 years ago.  Was released by melanoma clinic last year.  Still sees derm regularly Wilhemina Bonito).  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   Meds, vitals, and allergies reviewed.   ROS: See HPI.  Otherwise, noncontributory.  ncat Neck supple, no LA in the neck or either axilla Small nontender mass <1cm near the R triceps, not in the axilla.

## 2013-03-12 ENCOUNTER — Telehealth: Payer: Self-pay | Admitting: Family Medicine

## 2013-03-12 DIAGNOSIS — L989 Disorder of the skin and subcutaneous tissue, unspecified: Secondary | ICD-10-CM | POA: Insufficient documentation

## 2013-03-12 NOTE — Telephone Encounter (Signed)
Call pt.  Talked to Dr. Ronnald Ramp.  He'll check the spot on his arm.  Have pt call derm and ask for Janett Billow to get scheduled.  I thanked Dr. Ronnald Ramp for his help.

## 2013-03-12 NOTE — Assessment & Plan Note (Signed)
Likely benign, given hx will d/w derm and notify pt.  Pt agrees.

## 2013-03-13 NOTE — Telephone Encounter (Signed)
Patient notified as instructed by telephone. 

## 2013-04-13 ENCOUNTER — Other Ambulatory Visit: Payer: Self-pay | Admitting: Family Medicine

## 2013-04-13 DIAGNOSIS — E785 Hyperlipidemia, unspecified: Secondary | ICD-10-CM

## 2013-04-18 ENCOUNTER — Other Ambulatory Visit (INDEPENDENT_AMBULATORY_CARE_PROVIDER_SITE_OTHER): Payer: 59

## 2013-04-18 DIAGNOSIS — E785 Hyperlipidemia, unspecified: Secondary | ICD-10-CM

## 2013-04-18 LAB — COMPREHENSIVE METABOLIC PANEL
ALBUMIN: 4.5 g/dL (ref 3.5–5.2)
ALT: 56 U/L — ABNORMAL HIGH (ref 0–53)
AST: 32 U/L (ref 0–37)
Alkaline Phosphatase: 66 U/L (ref 39–117)
BUN: 18 mg/dL (ref 6–23)
CALCIUM: 8.9 mg/dL (ref 8.4–10.5)
CHLORIDE: 107 meq/L (ref 96–112)
CO2: 26 meq/L (ref 19–32)
Creatinine, Ser: 1.3 mg/dL (ref 0.4–1.5)
GFR: 61.91 mL/min (ref >60.00)
GLUCOSE: 97 mg/dL (ref 70–99)
Potassium: 4.3 mEq/L (ref 3.5–5.1)
SODIUM: 141 meq/L (ref 135–145)
TOTAL PROTEIN: 7.1 g/dL (ref 6.0–8.3)
Total Bilirubin: 0.9 mg/dL (ref 0.3–1.2)

## 2013-04-18 LAB — LIPID PANEL
Cholesterol: 176 mg/dL (ref 0–200)
HDL: 34.5 mg/dL — AB (ref >39.00)
LDL Cholesterol: 100 mg/dL — ABNORMAL HIGH (ref 0–99)
Total CHOL/HDL Ratio: 5
Triglycerides: 210 mg/dL — ABNORMAL HIGH (ref 0.0–149.0)
VLDL: 42 mg/dL — ABNORMAL HIGH (ref 0.0–40.0)

## 2013-04-25 ENCOUNTER — Ambulatory Visit (INDEPENDENT_AMBULATORY_CARE_PROVIDER_SITE_OTHER): Payer: 59 | Admitting: Family Medicine

## 2013-04-25 ENCOUNTER — Encounter: Payer: Self-pay | Admitting: Family Medicine

## 2013-04-25 VITALS — BP 130/80 | HR 64 | Temp 97.7°F | Ht 71.0 in | Wt 216.5 lb

## 2013-04-25 DIAGNOSIS — Z1211 Encounter for screening for malignant neoplasm of colon: Secondary | ICD-10-CM

## 2013-04-25 DIAGNOSIS — R945 Abnormal results of liver function studies: Secondary | ICD-10-CM

## 2013-04-25 DIAGNOSIS — Z0000 Encounter for general adult medical examination without abnormal findings: Secondary | ICD-10-CM

## 2013-04-25 DIAGNOSIS — Z23 Encounter for immunization: Secondary | ICD-10-CM

## 2013-04-25 DIAGNOSIS — Z2911 Encounter for prophylactic immunotherapy for respiratory syncytial virus (RSV): Secondary | ICD-10-CM

## 2013-04-25 NOTE — Addendum Note (Signed)
Addended by: Josetta Huddle on: 04/25/2013 01:14 PM   Modules accepted: Orders

## 2013-04-25 NOTE — Assessment & Plan Note (Signed)
Routine anticipatory guidance given to patient.  See health maintenance. Tetanus shot discussed, would not get given his hx.  PNA at 67.  Flu shot done at work.  Shingles d/w pt.  He wants to get it.  He checked on coverage.  Prostate cancer screening and PSA options (with potential risks and benefits of testing vs not testing) were discussed along with recent recs/guidelines.  He declined testing PSA at this point. Colonoscopy due this year, in the fall.   Referral placed.  Diet and exercise d/w pt.  Working out.  Doing well.  Living will. Wife would be designated if incapacitated. He would want his children involved in decisions.

## 2013-04-25 NOTE — Assessment & Plan Note (Signed)
Labs d/w pt. Likely fatty liver.  He'll work on weight loss.

## 2013-04-25 NOTE — Progress Notes (Signed)
Pre visit review using our clinic review tool, if applicable. No additional management support is needed unless otherwise documented below in the visit note.  CPE- See plan.  Routine anticipatory guidance given to patient.  See health maintenance. Tetanus shot discussed, would not get given his hx.  PNA at 67.  Flu shot done at work.  Shingles d/w pt.  He wants to get it.  He checked on coverage.  Prostate cancer screening and PSA options (with potential risks and benefits of testing vs not testing) were discussed along with recent recs/guidelines.  He declined testing PSA at this point. Colonoscopy due this year, in the fall.   Referral placed.  Diet and exercise d/w pt.  Working out.  Doing well.  Living will. Wife would be designated if incapacitated. He would want his children involved in decisions.   He had routine f/u with derm.  Had a [place] removed recently.    Labs d/w pt.  Mild inc in TG noted.  Likely fatty liver changes.  Had improved with weight loss prev.    PMH and SH reviewed  Meds, vitals, and allergies reviewed.   ROS: See HPI.  Otherwise negative.    GEN: nad, alert and oriented HEENT: mucous membranes moist NECK: supple w/o LA CV: rrr. PULM: ctab, no inc wob ABD: soft, +bs EXT: no edema SKIN: no acute rash

## 2013-04-25 NOTE — Patient Instructions (Addendum)
Douglas Gaines will call about your referral. Take care. Recheck in 1 year.  I would get a flu shot each fall.

## 2014-01-02 ENCOUNTER — Telehealth: Payer: Self-pay | Admitting: Family Medicine

## 2014-01-02 DIAGNOSIS — Z1211 Encounter for screening for malignant neoplasm of colon: Secondary | ICD-10-CM

## 2014-01-02 NOTE — Telephone Encounter (Signed)
Patient says he phoned GI directly as he was told to do from our office previously and they asked him if he had ever had a colonoscopy.  He confirmed that he had one 10 years ago and they told him he had to have a referral and then they would call him.

## 2014-01-02 NOTE — Telephone Encounter (Signed)
Pt called stated it is time for his colonscopy  He goes to [facility]

## 2014-01-05 NOTE — Telephone Encounter (Signed)
Ordered, thanks.

## 2014-01-06 ENCOUNTER — Encounter: Payer: Self-pay | Admitting: Gastroenterology

## 2014-02-11 ENCOUNTER — Ambulatory Visit (AMBULATORY_SURGERY_CENTER): Payer: Self-pay

## 2014-02-11 VITALS — Ht 71.0 in | Wt 217.8 lb

## 2014-02-11 DIAGNOSIS — Z1211 Encounter for screening for malignant neoplasm of colon: Secondary | ICD-10-CM

## 2014-02-11 DIAGNOSIS — Z8371 Family history of colonic polyps: Secondary | ICD-10-CM

## 2014-02-11 MED ORDER — MOVIPREP 100 G PO SOLR: ORAL | Status: DC

## 2014-02-11 NOTE — Progress Notes (Signed)
Per pt, no allergies to soy or egg products.Pt not taking any weight loss meds or using  O2 at home. 

## 2014-02-26 ENCOUNTER — Ambulatory Visit (AMBULATORY_SURGERY_CENTER): Payer: 59 | Admitting: Gastroenterology

## 2014-02-26 ENCOUNTER — Encounter: Payer: Self-pay | Admitting: Gastroenterology

## 2014-02-26 VITALS — BP 104/66 | HR 62 | Temp 97.8°F | Resp 19 | Ht 71.0 in | Wt 217.0 lb

## 2014-02-26 DIAGNOSIS — Z1211 Encounter for screening for malignant neoplasm of colon: Secondary | ICD-10-CM

## 2014-02-26 MED ORDER — SODIUM CHLORIDE 0.9 % IV SOLN: 500.0000 mL | INTRAVENOUS | Status: DC

## 2014-02-26 NOTE — Progress Notes (Signed)
Report to PACU, RN, vss, BBS= Clear.  

## 2014-02-26 NOTE — Progress Notes (Signed)
No problems noted in the recovery room. maw 

## 2014-02-26 NOTE — Patient Instructions (Signed)
YOU HAD AN ENDOSCOPIC PROCEDURE TODAY AT THE [facility]: Refer to the procedure report that was given to you for any specific questions about what was found during the examination.  If the procedure report does not answer your questions, please call your gastroenterologist to clarify.  If you requested that your care partner not be given the details of your procedure findings, then the procedure report has been included in a sealed envelope for you to review at your convenience later.  YOU SHOULD EXPECT: Some feelings of bloating in the abdomen. Passage of more gas than usual.  Walking can help get rid of the air that was put into your GI tract during the procedure and reduce the bloating. If you had a lower endoscopy (such as a colonoscopy or flexible sigmoidoscopy) you may notice spotting of blood in your stool or on the toilet paper. If you underwent a bowel prep for your procedure, then you may not have a normal bowel movement for a few days.  DIET: Your first meal following the procedure should be a light meal and then it is ok to progress to your normal diet.  A half-sandwich or bowl of soup is an example of a good first meal.  Heavy or fried foods are harder to digest and may make you feel nauseous or bloated.  Likewise meals heavy in dairy and vegetables can cause extra gas to form and this can also increase the bloating.  Drink plenty of fluids but you should avoid alcoholic beverages for 24 hours.  ACTIVITY: Your care partner should take you home directly after the procedure.  You should plan to take it easy, moving slowly for the rest of the day.  You can resume normal activity the day after the procedure however you should NOT DRIVE or use heavy machinery for 24 hours (because of the sedation medicines used during the test).    SYMPTOMS TO REPORT IMMEDIATELY: A gastroenterologist can be reached at any hour.  During normal business hours, 8:30 AM to 5:00 PM Monday through Friday,  call [phone].  After hours and on weekends, please call the GI answering service at [phone] who will take a message and have the physician on call contact you.   Following lower endoscopy (colonoscopy or flexible sigmoidoscopy):  Excessive amounts of blood in the stool  Significant tenderness or worsening of abdominal pains  Swelling of the abdomen that is new, acute  Fever of 100F or higher  FOLLOW UP: If any biopsies were taken you will be contacted by phone or by letter within the next 1-3 weeks.  Call your gastroenterologist if you have not heard about the biopsies in 3 weeks.  Our staff will call the home number listed on your records the next business day following your procedure to check on you and address any questions or concerns that you may have at that time regarding the information given to you following your procedure. This is a courtesy call and so if there is no answer at the home number and we have not heard from you through the emergency physician on call, we will assume that you have returned to your regular daily activities without incident.  SIGNATURES/CONFIDENTIALITY: You and/or your care partner have signed paperwork which will be entered into your electronic medical record.  These signatures attest to the fact that that the information above on your After Visit Summary has been reviewed and is understood.  Full responsibility of the confidentiality of this   discharge information lies with you and/or your care-partner.     Handouts were given to your care partner on diverticulosis and a high fiber diet with liberal fluid intake. You might notice some irritation in your nose or drainage.  This may cause feelings of congestion.  This is from the oxygen, which can be drying.  This is no cause for concern; this should clear up in a few days.  You may resume your current medications today. Please call if any questions or concerns.   

## 2014-02-26 NOTE — Op Note (Signed)
[place]  [address]. [place], [address]   COLONOSCOPY PROCEDURE REPORT  PATIENT: Douglas Gaines, Douglas Gaines  MR#: [id number] BIRTHDATE: [birth year] , 63  yrs. old GENDER: male ENDOSCOPIST: Ladene Artist, MD, [facility] REFERRED YH:[id number] Damita Dunnings, M.D. PROCEDURE DATE:  02/26/2014 PROCEDURE:   Colonoscopy, screening First Screening Colonoscopy - Avg.  risk and is 50 yrs.  old or older - No.  Prior Negative Screening - Now for repeat screening. 10 or more years since last screening  History of Adenoma - Now for follow-up colonoscopy & has been > or = to 3 yrs.  N/A  Polyps Removed Today? No.  Polyps Removed Today? No.  Recommend repeat exam, <10 yrs? Polyps Removed Today? No.  Recommend repeat exam, <10 yrs? No. ASA CLASS:   Class II INDICATIONS:average risk for colorectal cancer. MEDICATIONS: Monitored anesthesia care and Propofol 300 mg IV DESCRIPTION OF PROCEDURE:   After the risks benefits and alternatives of the procedure were thoroughly explained, informed consent was obtained.  The digital rectal exam revealed no abnormalities of the rectum.   The LB [id number]  endoscope was introduced through the anus and advanced to the cecum, which was identified by both the appendix and ileocecal valve. No adverse events experienced.   The quality of the prep was excellent, using MoviPrep  The instrument was then slowly withdrawn as the colon was fully examined.  COLON FINDINGS: There was moderate diverticulosis noted in the descending colon and sigmoid colon.   The examination was otherwise normal.  Retroflexed views revealed no abnormalities. The time to cecum=1 minutes 21 seconds.  Withdrawal time=8 minutes 51 seconds. The scope was withdrawn and the procedure completed. COMPLICATIONS: There were no immediate complications.  ENDOSCOPIC IMPRESSION: 1.   Moderate diverticulosis in the descending colon and sigmoid colon 2.   The examination was otherwise  normal  RECOMMENDATIONS: 1.  High fiber diet with liberal fluid intake. 2.  You should continue to follow colorectal cancer screening guidelines for "routine risk" patients with a repeat colonoscopy in 10 years.  There is no need for routine, screening FOBT (stool) testing for at least 5 years.  eSigned:  Ladene Artist, MD, [facility] 02/26/2014 8:25 AM

## 2014-02-27 ENCOUNTER — Telehealth: Payer: Self-pay | Admitting: *Deleted

## 2014-02-27 NOTE — Telephone Encounter (Signed)
  Follow up Call-  Call back number 02/26/2014  Post procedure Call Back phone  # [phone]  Permission to leave phone message Yes     Patient questions:  Left message to call us if necessary.

## 2014-04-28 ENCOUNTER — Other Ambulatory Visit: Payer: Self-pay | Admitting: Family Medicine

## 2014-04-28 DIAGNOSIS — E781 Pure hyperglyceridemia: Secondary | ICD-10-CM

## 2014-04-29 ENCOUNTER — Other Ambulatory Visit (INDEPENDENT_AMBULATORY_CARE_PROVIDER_SITE_OTHER): Payer: 59

## 2014-04-29 DIAGNOSIS — E781 Pure hyperglyceridemia: Secondary | ICD-10-CM | POA: Diagnosis not present

## 2014-04-29 LAB — COMPREHENSIVE METABOLIC PANEL
ALBUMIN: 4.4 g/dL (ref 3.5–5.2)
ALT: 38 U/L (ref 0–53)
AST: 24 U/L (ref 0–37)
Alkaline Phosphatase: 71 U/L (ref 39–117)
BUN: 15 mg/dL (ref 6–23)
CALCIUM: 9.8 mg/dL (ref 8.4–10.5)
CHLORIDE: 103 meq/L (ref 96–112)
CO2: 29 mEq/L (ref 19–32)
CREATININE: 1.21 mg/dL (ref 0.40–1.50)
GFR: 64.65 mL/min (ref >60.00)
GLUCOSE: 107 mg/dL — AB (ref 70–99)
POTASSIUM: 4.5 meq/L (ref 3.5–5.1)
Sodium: 138 mEq/L (ref 135–145)
TOTAL PROTEIN: 7.1 g/dL (ref 6.0–8.3)
Total Bilirubin: 0.8 mg/dL (ref 0.2–1.2)

## 2014-04-29 LAB — LIPID PANEL
CHOL/HDL RATIO: 5
Cholesterol: 169 mg/dL (ref 0–200)
HDL: 35.1 mg/dL — AB (ref >39.00)
LDL CALC: 100 mg/dL — AB (ref 0–99)
NONHDL: 133.9
TRIGLYCERIDES: 170 mg/dL — AB (ref 0.0–149.0)
VLDL: 34 mg/dL (ref 0.0–40.0)

## 2014-05-05 ENCOUNTER — Encounter: Payer: 59 | Admitting: Family Medicine

## 2014-05-06 ENCOUNTER — Encounter: Payer: Self-pay | Admitting: Family Medicine

## 2014-05-06 ENCOUNTER — Ambulatory Visit (INDEPENDENT_AMBULATORY_CARE_PROVIDER_SITE_OTHER): Payer: 59 | Admitting: Family Medicine

## 2014-05-06 VITALS — BP 122/78 | HR 62 | Temp 98.5°F | Ht 71.0 in | Wt 216.8 lb

## 2014-05-06 DIAGNOSIS — Z0000 Encounter for general adult medical examination without abnormal findings: Secondary | ICD-10-CM | POA: Diagnosis not present

## 2014-05-06 DIAGNOSIS — Z7189 Other specified counseling: Secondary | ICD-10-CM

## 2014-05-06 NOTE — Progress Notes (Signed)
Pre visit review using our clinic review tool, if applicable. No additional management support is needed unless otherwise documented below in the visit note.  CPE- See plan.  Routine anticipatory guidance given to patient.  See health maintenance. Tetanus shot discussed, would not get given his hx.  PNA at 67.  Flu shot done at work.  Shingles 2015 Prostate cancer screening and PSA options (with potential risks and benefits of testing vs not testing) were discussed along with recent recs/guidelines.  He declined testing PSA at this point. Colonoscopy 2016 Diet and exercise d/w pt. Working out. Doing well.   Living will.  Wife would be designated if incapacitated. He would want his children involved in decisions.   PMH and SH reviewed  Meds, vitals, and allergies reviewed.   ROS: See HPI.  Otherwise negative.    GEN: nad, alert and oriented HEENT: mucous membranes moist NECK: supple w/o LA CV: rrr. PULM: ctab, no inc wob ABD: soft, +bs EXT: no edema SKIN: no acute rash

## 2014-05-06 NOTE — Patient Instructions (Signed)
Take care.  Glad to see you.   Keep exercising.  Recheck in about 1 year.

## 2014-05-06 NOTE — Assessment & Plan Note (Addendum)
Routine anticipatory guidance given to patient.  See health maintenance. Tetanus shot discussed, would not get given his hx.  PNA at 67.  Flu shot done at work.  Shingles 2015 Prostate cancer screening and PSA options (with potential risks and benefits of testing vs not testing) were discussed along with recent recs/guidelines.  He declined testing PSA at this point. Colonoscopy 2016 Diet and exercise d/w pt. Working out. Doing well with that.  Mild inc in sugar and TG d/w pt.   Living will.  Wife would be designated if incapacitated. He would want his children involved in decisions.

## 2015-05-01 ENCOUNTER — Other Ambulatory Visit: Payer: Self-pay | Admitting: Family Medicine

## 2015-05-01 DIAGNOSIS — E781 Pure hyperglyceridemia: Secondary | ICD-10-CM

## 2015-05-05 ENCOUNTER — Other Ambulatory Visit: Payer: 59

## 2015-05-05 ENCOUNTER — Other Ambulatory Visit (INDEPENDENT_AMBULATORY_CARE_PROVIDER_SITE_OTHER): Payer: Commercial Managed Care - HMO

## 2015-05-05 DIAGNOSIS — E781 Pure hyperglyceridemia: Secondary | ICD-10-CM | POA: Diagnosis not present

## 2015-05-05 LAB — COMPREHENSIVE METABOLIC PANEL
ALT: 57 U/L — ABNORMAL HIGH (ref 0–53)
AST: 32 U/L (ref 0–37)
Albumin: 4.6 g/dL (ref 3.5–5.2)
Alkaline Phosphatase: 58 U/L (ref 39–117)
BUN: 20 mg/dL (ref 6–23)
CHLORIDE: 104 meq/L (ref 96–112)
CO2: 30 meq/L (ref 19–32)
CREATININE: 1.14 mg/dL (ref 0.40–1.50)
Calcium: 9.5 mg/dL (ref 8.4–10.5)
GFR: 69.02 mL/min (ref >60.00)
Glucose, Bld: 111 mg/dL — ABNORMAL HIGH (ref 70–99)
Potassium: 4.7 mEq/L (ref 3.5–5.1)
SODIUM: 139 meq/L (ref 135–145)
Total Bilirubin: 0.9 mg/dL (ref 0.2–1.2)
Total Protein: 7.2 g/dL (ref 6.0–8.3)

## 2015-05-05 LAB — LIPID PANEL
CHOL/HDL RATIO: 6
Cholesterol: 191 mg/dL (ref 0–200)
HDL: 31 mg/dL — ABNORMAL LOW (ref >39.00)
LDL Cholesterol: 122 mg/dL — ABNORMAL HIGH (ref 0–99)
NONHDL: 159.63
Triglycerides: 186 mg/dL — ABNORMAL HIGH (ref 0.0–149.0)
VLDL: 37.2 mg/dL (ref 0.0–40.0)

## 2015-05-11 ENCOUNTER — Encounter: Payer: Self-pay | Admitting: Family Medicine

## 2015-05-11 ENCOUNTER — Ambulatory Visit (INDEPENDENT_AMBULATORY_CARE_PROVIDER_SITE_OTHER): Payer: Commercial Managed Care - HMO | Admitting: Family Medicine

## 2015-05-11 ENCOUNTER — Telehealth: Payer: Self-pay

## 2015-05-11 VITALS — BP 114/76 | HR 60 | Temp 98.0°F | Ht 71.0 in | Wt 218.8 lb

## 2015-05-11 DIAGNOSIS — Z0000 Encounter for general adult medical examination without abnormal findings: Secondary | ICD-10-CM

## 2015-05-11 DIAGNOSIS — Z119 Encounter for screening for infectious and parasitic diseases, unspecified: Secondary | ICD-10-CM

## 2015-05-11 MED ORDER — SILDENAFIL CITRATE 20 MG PO TABS: 60.0000 mg | ORAL_TABLET | Freq: Every day | ORAL | Status: DC | PRN

## 2015-05-11 NOTE — Patient Instructions (Signed)
Take care.  Glad to see you.  Keep working on your weight in the meantime.   Recheck next year.

## 2015-05-11 NOTE — Telephone Encounter (Signed)
Pt was seen earlier today and pt does request generic Vi[facility] to [facility]. Pt said discussed earlier with Dr Damita Dunnings.

## 2015-05-11 NOTE — Telephone Encounter (Signed)
Sent. Thanks.   

## 2015-05-11 NOTE — Progress Notes (Signed)
Pre visit review using our clinic review tool, if applicable. No additional management support is needed unless otherwise documented below in the visit note.  CPE- See plan.  Routine anticipatory guidance given to patient.  See health maintenance. Tetanus shot discussed, would not get given his hx. Douglas Gaines.  Flu shot done at work.  Shingles 2015 Prostate cancer screening and PSA options (with potential risks and benefits of testing vs not testing) were discussed along with recent recs/guidelines. He declined testing PSA at this point. Colonoscopy 2016 Diet and exercise d/w pt. Working out. Doing well.   Living will. Wife would be designated if incapacitated. He would want his children involved in decisions.  Pt opts in for HCV and HIV screening with next set of labs.  D/w pt re: routine screening.   ED noted.  He asked about options.  He wanted to consider.  Would be healthy enough, ie no contraindication, to sildenafil or similar.    PMH and SH reviewed  Meds, vitals, and allergies reviewed.   ROS: See HPI.  Otherwise negative.    GEN: nad, alert and oriented HEENT: mucous membranes moist NECK: supple w/o LA CV: rrr. PULM: ctab, no inc wob ABD: soft, +bs EXT: no edema SKIN: no acute rash

## 2015-05-13 NOTE — Assessment & Plan Note (Signed)
Tetanus shot discussed, would not get given his hx.  PNA at 67.  Flu shot done at work.  Shingles 2015  Prostate cancer screening and PSA options (with potential risks and benefits of testing vs not testing) were discussed along with recent recs/guidelines. He declined testing PSA at this point.  Colonoscopy 2016  Diet and exercise d/w pt. Working out. Doing well.  Living will. Wife would be designated if incapacitated. He would want his children involved in decisions.  Pt opts in for HCV and HIV screening with next set of labs. D/w pt re: routine screening.  ED noted. He asked about options. He wanted to consider. Would be healthy enough, ie no contraindication, to sildenafil or similar.

## 2015-06-23 ENCOUNTER — Telehealth: Payer: Self-pay

## 2015-06-23 NOTE — Telephone Encounter (Signed)
[facility] request refill sildenafil; pt has available refills at [facility]; contact # given for [facility] to transfer sildenafil refills. Pharmacist voiced understanding.

## 2015-09-21 NOTE — Telephone Encounter (Addendum)
Pt left /vm that ins will only allow sildenafil filled at any other pharmacy except [facility]. Douglas Gaines will be sending faxed request for sildenafil today; [facility] did not received the sildenafil in 06/2015. Pt request cb when completed. Last annual 05/11/15.Please advise.

## 2015-09-22 NOTE — Telephone Encounter (Signed)
I'll await the fax.  I haven't seen it yet.  Thanks.

## 2015-09-22 NOTE — Telephone Encounter (Signed)
Fax received and placed in Dr. Josefine Class In Box.

## 2015-09-23 ENCOUNTER — Other Ambulatory Visit: Payer: Self-pay

## 2015-09-23 NOTE — Telephone Encounter (Signed)
Pt left v/m requesting refill sildenafil [facility] (last refilled # 50 x5 on 05/11/15 to [facility]). Pt last seen 05/11/15.Please advise.

## 2015-09-23 NOTE — Telephone Encounter (Signed)
I'll work on the hard copy.  Thanks.  

## 2015-09-24 MED ORDER — SILDENAFIL CITRATE 20 MG PO TABS: 60.0000 mg | ORAL_TABLET | Freq: Every day | ORAL | 5 refills | Status: DC | PRN

## 2015-09-24 NOTE — Telephone Encounter (Signed)
Sent. Thanks.   

## 2015-09-27 ENCOUNTER — Encounter: Payer: Self-pay | Admitting: Family Medicine

## 2015-09-27 DIAGNOSIS — N529 Male erectile dysfunction, unspecified: Secondary | ICD-10-CM | POA: Insufficient documentation

## 2015-09-28 NOTE — Telephone Encounter (Signed)
Faxed to [phone]

## 2016-05-02 ENCOUNTER — Other Ambulatory Visit: Payer: Self-pay | Admitting: Family Medicine

## 2016-05-02 DIAGNOSIS — E781 Pure hyperglyceridemia: Secondary | ICD-10-CM

## 2016-05-05 ENCOUNTER — Other Ambulatory Visit (INDEPENDENT_AMBULATORY_CARE_PROVIDER_SITE_OTHER): Payer: Commercial Managed Care - HMO

## 2016-05-05 DIAGNOSIS — Z119 Encounter for screening for infectious and parasitic diseases, unspecified: Secondary | ICD-10-CM

## 2016-05-05 DIAGNOSIS — E781 Pure hyperglyceridemia: Secondary | ICD-10-CM | POA: Diagnosis not present

## 2016-05-05 LAB — COMPREHENSIVE METABOLIC PANEL
ALT: 58 U/L — ABNORMAL HIGH (ref 0–53)
AST: 28 U/L (ref 0–37)
Albumin: 4.5 g/dL (ref 3.5–5.2)
Alkaline Phosphatase: 58 U/L (ref 39–117)
BUN: 20 mg/dL (ref 6–23)
CALCIUM: 9.4 mg/dL (ref 8.4–10.5)
CHLORIDE: 105 meq/L (ref 96–112)
CO2: 30 meq/L (ref 19–32)
Creatinine, Ser: 1.15 mg/dL (ref 0.40–1.50)
GFR: 68.11 mL/min (ref >60.00)
Glucose, Bld: 104 mg/dL — ABNORMAL HIGH (ref 70–99)
Potassium: 4.4 mEq/L (ref 3.5–5.1)
Sodium: 140 mEq/L (ref 135–145)
Total Bilirubin: 0.9 mg/dL (ref 0.2–1.2)
Total Protein: 6.9 g/dL (ref 6.0–8.3)

## 2016-05-05 LAB — LIPID PANEL
CHOL/HDL RATIO: 5
Cholesterol: 189 mg/dL (ref 0–200)
HDL: 37.4 mg/dL — ABNORMAL LOW (ref >39.00)
LDL Cholesterol: 117 mg/dL — ABNORMAL HIGH (ref 0–99)
NONHDL: 151.63
Triglycerides: 173 mg/dL — ABNORMAL HIGH (ref 0.0–149.0)
VLDL: 34.6 mg/dL (ref 0.0–40.0)

## 2016-05-05 NOTE — Addendum Note (Signed)
Addended by: Ellamae Sia on: 05/05/2016 10:01 AM   Modules accepted: Orders

## 2016-05-06 LAB — HIV ANTIBODY (ROUTINE TESTING W REFLEX): HIV 1&2 Ab, 4th Generation: NONREACTIVE

## 2016-05-06 LAB — HEPATITIS C ANTIBODY: HCV Ab: NEGATIVE

## 2016-05-12 ENCOUNTER — Encounter: Payer: Self-pay | Admitting: Family Medicine

## 2016-05-12 ENCOUNTER — Ambulatory Visit (INDEPENDENT_AMBULATORY_CARE_PROVIDER_SITE_OTHER): Payer: Commercial Managed Care - HMO | Admitting: Family Medicine

## 2016-05-12 VITALS — BP 126/86 | HR 60 | Temp 98.4°F | Ht 71.0 in | Wt 218.5 lb

## 2016-05-12 DIAGNOSIS — N529 Male erectile dysfunction, unspecified: Secondary | ICD-10-CM

## 2016-05-12 DIAGNOSIS — R7309 Other abnormal glucose: Secondary | ICD-10-CM

## 2016-05-12 DIAGNOSIS — Z0000 Encounter for general adult medical examination without abnormal findings: Secondary | ICD-10-CM | POA: Diagnosis not present

## 2016-05-12 DIAGNOSIS — Z7189 Other specified counseling: Secondary | ICD-10-CM

## 2016-05-12 DIAGNOSIS — R945 Abnormal results of liver function studies: Secondary | ICD-10-CM

## 2016-05-12 MED ORDER — SILDENAFIL CITRATE 20 MG PO TABS: 60.0000 mg | ORAL_TABLET | Freq: Every day | ORAL | 12 refills | Status: DC | PRN

## 2016-05-12 NOTE — Patient Instructions (Addendum)
Take care.  Glad to see you.  Update me as needed.   Recheck in about 1 year.   Good luck with the season this fall.

## 2016-05-12 NOTE — Progress Notes (Signed)
CPE- See plan.  Routine anticipatory guidance given to patient.  See health maintenance.  The possibility exists that previously documented standard health maintenance information may have been brought forward from a previous encounter into this note.  If needed, that same information has been updated to reflect the current situation based on today's encounter.    Tetanus shot discussed, would not get given his hx. PNA at 67.  Flu shot done at work.  Shingles 2015 Prostate cancer screening and PSA options (with potential risks and benefits of testing vs not testing) were discussed along with recent recs/guidelines. He declined testing PSA at this point. Colonoscopy 2016 Diet and exercise d/w pt. Working out. Doing well.   Living will. Wife would be designated if incapacitated. He would want his children involved in decisions.  HCV and HIV screening done.   ED noted. Treated w/o complication with viagra.    PMH and SH reviewed  Meds, vitals, and allergies reviewed.   ROS: Per HPI.  Unless specifically indicated otherwise in HPI, the patient denies:  General: fever. Eyes: acute vision changes ENT: sore throat Cardiovascular: chest pain Respiratory: SOB GI: vomiting GU: dysuria Musculoskeletal: acute back pain Derm: acute rash Neuro: acute motor dysfunction Psych: worsening mood Endocrine: polydipsia Heme: bleeding Allergy: hayfever  GEN: nad, alert and oriented HEENT: mucous membranes moist NECK: supple w/o LA CV: rrr. PULM: ctab, no inc wob ABD: soft, +bs EXT: no edema SKIN: no acute rash

## 2016-05-12 NOTE — Progress Notes (Signed)
Pre visit review using our clinic review tool, if applicable. No additional management support is needed unless otherwise documented below in the visit note. 

## 2016-05-13 NOTE — Assessment & Plan Note (Signed)
Tetanus shot discussed, would not get given his hx. PNA at 67.  Flu shot done at work.  Shingles 2015 Prostate cancer screening and PSA options (with potential risks and benefits of testing vs not testing) were discussed along with recent recs/guidelines. He declined testing PSA at this point. Colonoscopy 2016 Diet and exercise d/w pt. Working out. Doing well.   Living will. Wife would be designated if incapacitated. He would want his children involved in decisions.  HCV and HIV screening done.

## 2016-05-13 NOTE — Assessment & Plan Note (Signed)
Minimal elevation, no jaundice. Improved from previous his normal levels. Distant ultrasound previously noted fatty liver. Discussed with patient. Continue work on diet and exercise. Labs discussed with patient.

## 2016-05-13 NOTE — Assessment & Plan Note (Signed)
Living will. Wife would be designated if incapacitated. He would want his children involved in decisions.

## 2016-05-13 NOTE — Assessment & Plan Note (Signed)
ED noted. Treated w/o complication with viagra.

## 2016-05-13 NOTE — Assessment & Plan Note (Signed)
Discussed with patient about diet and exercise. 

## 2017-03-15 ENCOUNTER — Other Ambulatory Visit: Payer: Self-pay | Admitting: *Deleted

## 2017-03-15 ENCOUNTER — Telehealth: Payer: Self-pay | Admitting: Family Medicine

## 2017-03-15 MED ORDER — SILDENAFIL CITRATE 20 MG PO TABS: 60.0000 mg | ORAL_TABLET | Freq: Every day | ORAL | 12 refills | Status: DC | PRN

## 2017-03-15 NOTE — Telephone Encounter (Signed)
Copied from [place] [id number]. Topic: Quick Communication - Rx Refill/Question >> Mar 15, 2017  4:55 PM Bea Graff, NT wrote: Medication:sildenafil ([place])   Has the patient contacted their pharmacy? Yes.     (Agent: If no, request that the patient contact the pharmacy for the refill.)   Preferred Pharmacy (with phone number or street name): [facility]: Please be advised that RX refills may take up to 3 business days. We ask that you follow-up with your pharmacy.

## 2017-03-16 NOTE — Telephone Encounter (Signed)
Relation to pt: self  Call back number:[phone] Pharmacy: [facility], [place] [phone] (Phone) [phone] (Fax)     Reason for call:   Patient states PA is needed for sildenafil (REVATIO) 20 MG tablet, patient spoke with insurance and they advised requesting new Rx reflecting 1/2 tablet a day 15 day supply, patient would like Rx sent to retail, please advise

## 2017-03-17 ENCOUNTER — Other Ambulatory Visit: Payer: Self-pay | Admitting: Family Medicine

## 2017-03-17 NOTE — Telephone Encounter (Signed)
See note below and the new Rx prepared with new instructions.

## 2017-03-17 NOTE — Telephone Encounter (Signed)
See Phone message.  I ordered the Sildenafil as requested by patient who says his insurance will cover it this way but I've never heard of it done like this.  Please advise.

## 2017-03-19 NOTE — Telephone Encounter (Signed)
I can send this in mult ways, but I don't expect him to get much benefit from 10mg  a day.  If he has taken 10mg  a day with relief, then update me.  I can't send rx for 15 day supply with 10mg  a day- that would be 7.5 tabs.   Which pharmacy is he going to use? Let me know.  Thanks.

## 2017-03-19 NOTE — Telephone Encounter (Signed)
See refill request.  Thanks.

## 2017-03-20 ENCOUNTER — Other Ambulatory Visit: Payer: Self-pay | Admitting: *Deleted

## 2017-03-20 MED ORDER — SILDENAFIL CITRATE 20 MG PO TABS: ORAL_TABLET | ORAL | 3 refills | Status: DC

## 2017-03-20 NOTE — Telephone Encounter (Signed)
Patient now says his insurance would only pay for one half tablet (10 mg total) once daily for a 30 day supply.  Patient says he has done some price checking and would like his original prescription sent to [facility].  Rx sent.

## 2017-05-21 ENCOUNTER — Other Ambulatory Visit: Payer: Self-pay | Admitting: Family Medicine

## 2017-05-21 DIAGNOSIS — E781 Pure hyperglyceridemia: Secondary | ICD-10-CM

## 2017-05-21 DIAGNOSIS — R7309 Other abnormal glucose: Secondary | ICD-10-CM

## 2017-05-23 ENCOUNTER — Encounter: Payer: Commercial Managed Care - HMO | Admitting: Family Medicine

## 2017-05-23 ENCOUNTER — Other Ambulatory Visit (INDEPENDENT_AMBULATORY_CARE_PROVIDER_SITE_OTHER): Payer: 59

## 2017-05-23 DIAGNOSIS — R7309 Other abnormal glucose: Secondary | ICD-10-CM

## 2017-05-23 DIAGNOSIS — E781 Pure hyperglyceridemia: Secondary | ICD-10-CM | POA: Diagnosis not present

## 2017-05-23 LAB — COMPREHENSIVE METABOLIC PANEL
ALBUMIN: 4.3 g/dL (ref 3.5–5.2)
ALK PHOS: 60 U/L (ref 39–117)
ALT: 57 U/L — ABNORMAL HIGH (ref 0–53)
AST: 29 U/L (ref 0–37)
BUN: 20 mg/dL (ref 6–23)
CO2: 31 mEq/L (ref 19–32)
CREATININE: 1.14 mg/dL (ref 0.40–1.50)
Calcium: 9.2 mg/dL (ref 8.4–10.5)
Chloride: 106 mEq/L (ref 96–112)
GFR: 68.57 mL/min (ref >60.00)
Glucose, Bld: 104 mg/dL — ABNORMAL HIGH (ref 70–99)
POTASSIUM: 4.4 meq/L (ref 3.5–5.1)
SODIUM: 141 meq/L (ref 135–145)
Total Bilirubin: 0.8 mg/dL (ref 0.2–1.2)
Total Protein: 6.6 g/dL (ref 6.0–8.3)

## 2017-05-23 LAB — LIPID PANEL
CHOL/HDL RATIO: 5
Cholesterol: 166 mg/dL (ref 0–200)
HDL: 36.8 mg/dL — ABNORMAL LOW (ref >39.00)
LDL CALC: 94 mg/dL (ref 0–99)
NONHDL: 129.2
TRIGLYCERIDES: 178 mg/dL — AB (ref 0.0–149.0)
VLDL: 35.6 mg/dL (ref 0.0–40.0)

## 2017-05-29 ENCOUNTER — Encounter: Payer: Self-pay | Admitting: Family Medicine

## 2017-05-29 ENCOUNTER — Ambulatory Visit (INDEPENDENT_AMBULATORY_CARE_PROVIDER_SITE_OTHER): Payer: 59 | Admitting: Family Medicine

## 2017-05-29 VITALS — BP 120/78 | HR 59 | Temp 97.4°F | Ht 71.0 in | Wt 224.5 lb

## 2017-05-29 DIAGNOSIS — Z0000 Encounter for general adult medical examination without abnormal findings: Secondary | ICD-10-CM | POA: Diagnosis not present

## 2017-05-29 DIAGNOSIS — E781 Pure hyperglyceridemia: Secondary | ICD-10-CM

## 2017-05-29 DIAGNOSIS — N529 Male erectile dysfunction, unspecified: Secondary | ICD-10-CM

## 2017-05-29 DIAGNOSIS — R7309 Other abnormal glucose: Secondary | ICD-10-CM

## 2017-05-29 DIAGNOSIS — Z7189 Other specified counseling: Secondary | ICD-10-CM

## 2017-05-29 MED ORDER — SILDENAFIL CITRATE 20 MG PO TABS: ORAL_TABLET | ORAL | 12 refills | Status: DC

## 2017-05-29 NOTE — Patient Instructions (Addendum)
Stop the aspirin.  Keep exercising.  Update me as needed.  Take care.  Glad to see you.  I would get a flu shot each fall.

## 2017-05-29 NOTE — Progress Notes (Signed)
CPE- See plan.  Routine anticipatory guidance given to patient.  See health maintenance.  The possibility exists that previously documented standard health maintenance information may have been brought forward from a previous encounter into this note.  If needed, that same information has been updated to reflect the current situation based on today's encounter.    Tetanus shot discussed, would not get given his hx. PNA at 67.  Flu shot done at work.  Shingles 2015 Prostate cancer screening and PSA options (with potential risks and benefits of testing vs not testing) were discussed along with recent recs/guidelines. He declined testing PSA at this point. Colonoscopy 2016 Diet and exercise d/w pt. Working out. Doing well.  Living will. Wife would be designated if patient were incapacitated. He would want his children involved in decisions.  HCV and HIV screening done prev .   Labs d/w pt.  ASCVD ~11% but that doesn't include neg FH, so this may overstate his risk.  We talked about cessation of aspirin.  ED improved with sildenafil.  No ADE on med.    PMH and SH reviewed  Meds, vitals, and allergies reviewed.   ROS: Per HPI.  Unless specifically indicated otherwise in HPI, the patient denies:  General: fever. Eyes: acute vision changes ENT: sore throat Cardiovascular: chest pain Respiratory: SOB GI: vomiting GU: dysuria Musculoskeletal: acute back pain Derm: acute rash Neuro: acute motor dysfunction Psych: worsening mood Endocrine: polydipsia Heme: bleeding Allergy: hayfever  GEN: nad, alert and oriented HEENT: mucous membranes moist NECK: supple w/o LA CV: rrr. PULM: ctab, no inc wob ABD: soft, +bs EXT: no edema SKIN: no acute rash

## 2017-05-30 ENCOUNTER — Encounter: Payer: Commercial Managed Care - HMO | Admitting: Family Medicine

## 2017-05-30 NOTE — Assessment & Plan Note (Signed)
Labs d/w pt.  ASCVD ~11% but that doesn't include neg FH, so this may overstate his risk.  We talked about cessation of aspirin and primary prevention with statin.  He will consider.  We will follow with yearly lipids.  Continue work on diet and exercise.  He agrees.

## 2017-05-30 NOTE — Assessment & Plan Note (Signed)
Living will. Wife would be designated if patient were incapacitated. He would want his children involved in decisions.

## 2017-05-30 NOTE — Assessment & Plan Note (Signed)
Tetanus shot discussed, would not get given his hx. PNA at 67.  Flu shot done at work.  Shingles 2015 Prostate cancer screening and PSA options (with potential risks and benefits of testing vs not testing) were discussed along with recent recs/guidelines. He declined testing PSA at this point. Colonoscopy 2016 Diet and exercise d/w pt. Working out. Doing well.  Living will. Wife would be designated if patient were incapacitated. He would want his children involved in decisions.  HCV and HIV screening done prev .

## 2017-05-30 NOTE — Assessment & Plan Note (Signed)
Continue work on diet and exercise. Labs d/w pt.

## 2017-05-30 NOTE — Assessment & Plan Note (Signed)
Continue sildenafil.  No adverse effect.

## 2018-05-29 ENCOUNTER — Other Ambulatory Visit: Payer: 59

## 2018-06-04 ENCOUNTER — Encounter: Payer: 59 | Admitting: Family Medicine

## 2018-08-07 ENCOUNTER — Other Ambulatory Visit: Payer: Self-pay | Admitting: Family Medicine

## 2018-08-07 DIAGNOSIS — R945 Abnormal results of liver function studies: Secondary | ICD-10-CM

## 2018-08-07 DIAGNOSIS — R7309 Other abnormal glucose: Secondary | ICD-10-CM

## 2018-08-07 DIAGNOSIS — E781 Pure hyperglyceridemia: Secondary | ICD-10-CM

## 2018-08-09 ENCOUNTER — Other Ambulatory Visit: Payer: Self-pay

## 2018-08-09 ENCOUNTER — Other Ambulatory Visit (INDEPENDENT_AMBULATORY_CARE_PROVIDER_SITE_OTHER): Payer: Medicare Other

## 2018-08-09 DIAGNOSIS — R945 Abnormal results of liver function studies: Secondary | ICD-10-CM | POA: Diagnosis not present

## 2018-08-09 DIAGNOSIS — R7309 Other abnormal glucose: Secondary | ICD-10-CM

## 2018-08-09 DIAGNOSIS — E781 Pure hyperglyceridemia: Secondary | ICD-10-CM | POA: Diagnosis not present

## 2018-08-09 LAB — COMPREHENSIVE METABOLIC PANEL
ALT: 41 U/L (ref 0–53)
AST: 24 U/L (ref 0–37)
Albumin: 4.5 g/dL (ref 3.5–5.2)
Alkaline Phosphatase: 64 U/L (ref 39–117)
BUN: 19 mg/dL (ref 6–23)
CO2: 31 mEq/L (ref 19–32)
Calcium: 9.2 mg/dL (ref 8.4–10.5)
Chloride: 104 mEq/L (ref 96–112)
Creatinine, Ser: 1.13 mg/dL (ref 0.40–1.50)
GFR: 64.93 mL/min (ref >60.00)
Glucose, Bld: 98 mg/dL (ref 70–99)
Potassium: 4.3 mEq/L (ref 3.5–5.1)
Sodium: 139 mEq/L (ref 135–145)
Total Bilirubin: 0.8 mg/dL (ref 0.2–1.2)
Total Protein: 6.5 g/dL (ref 6.0–8.3)

## 2018-08-09 LAB — LIPID PANEL
Cholesterol: 169 mg/dL (ref 0–200)
HDL: 34.8 mg/dL — ABNORMAL LOW (ref >39.00)
LDL Cholesterol: 99 mg/dL (ref 0–99)
NonHDL: 134
Total CHOL/HDL Ratio: 5
Triglycerides: 175 mg/dL — ABNORMAL HIGH (ref 0.0–149.0)
VLDL: 35 mg/dL (ref 0.0–40.0)

## 2018-08-09 LAB — HEMOGLOBIN A1C: Hgb A1c MFr Bld: 5.1 % (ref 4.6–6.5)

## 2018-08-13 ENCOUNTER — Encounter: Payer: Self-pay | Admitting: Family Medicine

## 2018-08-13 ENCOUNTER — Ambulatory Visit: Payer: Medicare Other | Admitting: Family Medicine

## 2018-08-13 ENCOUNTER — Other Ambulatory Visit: Payer: Self-pay

## 2018-08-13 VITALS — BP 122/80 | HR 61 | Temp 98.2°F | Ht 71.0 in | Wt 208.1 lb

## 2018-08-13 DIAGNOSIS — Z23 Encounter for immunization: Secondary | ICD-10-CM

## 2018-08-13 DIAGNOSIS — Z029 Encounter for administrative examinations, unspecified: Secondary | ICD-10-CM

## 2018-08-13 DIAGNOSIS — Z0000 Encounter for general adult medical examination without abnormal findings: Secondary | ICD-10-CM

## 2018-08-13 DIAGNOSIS — Z0001 Encounter for general adult medical examination with abnormal findings: Secondary | ICD-10-CM

## 2018-08-13 DIAGNOSIS — N529 Male erectile dysfunction, unspecified: Secondary | ICD-10-CM

## 2018-08-13 DIAGNOSIS — Z7189 Other specified counseling: Secondary | ICD-10-CM

## 2018-08-13 MED ORDER — SILDENAFIL CITRATE 20 MG PO TABS: ORAL_TABLET | ORAL | 12 refills | Status: DC

## 2018-08-13 NOTE — Progress Notes (Signed)
I have personally reviewed the Medicare Annual Wellness questionnaire and have noted 1. The patient's medical and social history 2. Their use of alcohol, tobacco or illicit drugs 3. Their current medications and supplements 4. The patient's functional ability including ADL's, fall risks, home safety risks and hearing or visual             impairment. 5. Diet and physical activities 6. Evidence for depression or mood disorders  The patients weight, height, BMI have been recorded in the chart and visual acuity is per eye clinic.  I have made referrals, counseling and provided education to the patient based review of the above and I have provided the pt with a written personalized care plan for preventive services.  Provider list updated- see scanned forms.  Routine anticipatory guidance given to patient.  See health maintenance. The possibility exists that previously documented standard health maintenance information may have been brought forward from a previous encounter into this note.  If needed, that same information has been updated to reflect the current situation based on today's encounter.    Flu done yearly Shingles discussed with patient PNA 2020 Tetanus deferred given his history. Colon cancer screening 2016. Prostate cancer screening and PSA options (with potential risks and benefits of testing vs not testing) were discussed along with recent recs/guidelines.  He declined testing PSA at this point. Advance directive-wife designated if patient were incapacitated.   Cognitive function addressed- see scanned forms- and if abnormal then additional documentation follows.   Vision screen noted in chart.  EKG noted in chart.  Whisper hearing test passed.  Labs discussed with patient.  Sugar slightly improved from previous.  Lipids are stable with mild triglyceride elevation.  He is working on diet and exercise.  Discussed.  Pandemic consideration d/w pt.    viagra worked with no ADE  on med.    Pandemic considerations discussed with patient.  PMH and SH reviewed  Meds, vitals, and allergies reviewed.   ROS: Per HPI.  Unless specifically indicated otherwise in HPI, the patient denies:  General: fever. Eyes: acute vision changes ENT: sore throat Cardiovascular: chest pain Respiratory: SOB GI: vomiting GU: dysuria Musculoskeletal: acute back pain Derm: acute rash Neuro: acute motor dysfunction Psych: worsening mood Endocrine: polydipsia Heme: bleeding Allergy: hayfever  GEN: nad, alert and oriented HEENT: mucous membranes moist NECK: supple w/o LA CV: rrr. PULM: ctab, no inc wob ABD: soft, +bs EXT: no edema SKIN: no acute rash

## 2018-08-13 NOTE — Patient Instructions (Addendum)
Check with your insurance to see if they will cover the shingrix shot. I would get a flu shot each fall.   Update me as needed.  Take care.  Glad to see you. 

## 2018-08-16 NOTE — Assessment & Plan Note (Signed)
Advance directive- wife designated if patient were incapacitated.  

## 2018-08-16 NOTE — Assessment & Plan Note (Signed)
Flu done yearly Shingles discussed with patient PNA 2020 Tetanus deferred given his history. Colon cancer screening 2016. Prostate cancer screening and PSA options (with potential risks and benefits of testing vs not testing) were discussed along with recent recs/guidelines.  He declined testing PSA at this point. Advance directive-wife designated if patient were incapacitated.   Cognitive function addressed- see scanned forms- and if abnormal then additional documentation follows.

## 2018-08-16 NOTE — Assessment & Plan Note (Signed)
viagra worked with no ADE on med.   Continue as is.

## 2018-11-08 ENCOUNTER — Other Ambulatory Visit: Payer: Self-pay | Admitting: Family Medicine

## 2018-11-09 NOTE — Telephone Encounter (Signed)
was filled on 08/13/2018 for 1 year worth of refills already

## 2018-12-06 ENCOUNTER — Ambulatory Visit (INDEPENDENT_AMBULATORY_CARE_PROVIDER_SITE_OTHER): Payer: Medicare Other | Admitting: Podiatry

## 2018-12-06 ENCOUNTER — Other Ambulatory Visit: Payer: Self-pay

## 2018-12-06 ENCOUNTER — Encounter: Payer: Self-pay | Admitting: Podiatry

## 2018-12-06 ENCOUNTER — Other Ambulatory Visit: Payer: Self-pay | Admitting: Podiatry

## 2018-12-06 ENCOUNTER — Ambulatory Visit (INDEPENDENT_AMBULATORY_CARE_PROVIDER_SITE_OTHER): Payer: Medicare Other

## 2018-12-06 DIAGNOSIS — M722 Plantar fascial fibromatosis: Secondary | ICD-10-CM

## 2018-12-06 DIAGNOSIS — M79671 Pain in right foot: Secondary | ICD-10-CM

## 2018-12-06 NOTE — Progress Notes (Signed)
Subjective:   Patient ID: Douglas Gaines, male   DOB: 68 y.o.   MRN: [id number]   HPI Patient states having a lot of pain on the bottom of the right heel for the last 5 months.  States does not remember specific injury and it is worse after periods of walking and sitting and in the morning when he gets up   Review of Systems  All other systems reviewed and are negative.       Objective:  Physical Exam Vitals signs and nursing note reviewed.  Constitutional:      Appearance: He is well-developed.  Pulmonary:     Effort: Pulmonary effort is normal.  Musculoskeletal: Normal range of motion.  Skin:    General: Skin is warm.  Neurological:     Mental Status: He is alert.     Neurovascular status found to be intact muscle strength was found to be adequate range of motion within normal limits.  Patient is noted to have exquisite discomfort plantar aspect right heel at the insertional point tendon into the calcaneus with inflammation fluid around the medial band.  Patient has good digital perfusion well oriented x3     Assessment:  Acute plantar fasciitis right with inflammation fluid around the medial band     Plan:  H&P condition reviewed and x-rays reviewed.  Today I injected the plantar fascia after sterile prep of the area 3 mg Kenalog 5 mg Xylocaine and applied fascial brace to lift up the arch and gave instructions for physical therapy shoe gear modifications.  Placed on diclofenac 75 mg twice daily and reappoint to recheck  X-rays indicate that there is spurring occurring of the medial right heel with no indication of stress fracture arthritis

## 2018-12-06 NOTE — Patient Instructions (Signed)

## 2018-12-20 ENCOUNTER — Ambulatory Visit: Payer: Medicare Other | Admitting: Podiatry

## 2018-12-21 ENCOUNTER — Encounter: Payer: Self-pay | Admitting: Podiatry

## 2018-12-21 ENCOUNTER — Ambulatory Visit: Payer: Medicare Other | Admitting: Podiatry

## 2018-12-21 ENCOUNTER — Other Ambulatory Visit: Payer: Self-pay

## 2018-12-21 DIAGNOSIS — M722 Plantar fascial fibromatosis: Secondary | ICD-10-CM | POA: Diagnosis not present

## 2018-12-22 NOTE — Progress Notes (Signed)
Subjective:   Patient ID: Douglas Gaines, male   DOB: 68 y.o.   MRN: [id number]   HPI Patient states improving quite a bit with mild discomfort if he does too much but overall pleased with his results to this point   ROS      Objective:  Physical Exam  Neurovascular status intact with patient noted to have moderate depression of the arch bilateral with inflammation fluid buildup around the medial band that is improved but still present with depressed arch noted     Assessment:  Plantar fasciitis with structural changes noted and inflammation of the insertion of the tendon into the calcaneus     Plan:  H&P condition reviewed and at this point working to go ahead continue physical therapy anti-inflammatory stretching exercises and patient is placed into a power step type insole to reduce the pressure on the arch.  We will see back as symptoms indicate

## 2019-02-19 ENCOUNTER — Telehealth: Payer: Self-pay

## 2019-02-19 NOTE — Telephone Encounter (Signed)
Pt has a covid vaccine schedule for 2/25. He is interested in getting an earlier appointment. His mother has an appointment on 1/26 that needs to be canceled because she had a vaccine at a different location. He wants to know if he can take her appointment and cancel his. Will have a nurse call him back.

## 2019-02-19 NOTE — Telephone Encounter (Signed)
Ret'd call to pt.  Advised pt. that due to a waitlist for other patients requesting COVID vaccine appts., we would not be able to move his appt. to an earlier time slot.  Pt. verb. understanding.  He requested to go ahead and cancel his mother's appt. on 1/26, so another patient would have the opportunity to get it.    Reminded of his appt. On 03/28/19 @ 10:15 AM.  Douglas Gaines. Understanding.  No further action taken at this time.

## 2019-03-01 ENCOUNTER — Ambulatory Visit: Payer: Medicare Other

## 2019-03-09 ENCOUNTER — Ambulatory Visit: Payer: Medicare Other | Attending: Internal Medicine

## 2019-03-09 DIAGNOSIS — Z23 Encounter for immunization: Secondary | ICD-10-CM

## 2019-03-09 NOTE — Progress Notes (Signed)
   Covid-19 Vaccination Clinic  Name:  Douglas Gaines    MRN: [id number] DOB: [birth year]  03/09/2019  [name] was observed post Covid-19 immunization for 15 minutes without incidence. He was provided with Vaccine Information Sheet and instruction to access the V-Safe system.   [name] was instructed to call 911 with any severe reactions post vaccine: Marland Kitchen Difficulty breathing  . Swelling of your face and throat  . A fast heartbeat  . A bad rash all over your body  . Dizziness and weakness    Immunizations Administered    Name Date Dose VIS Date Route   Pfizer COVID-19 Vaccine 03/09/2019  3:16 PM 0.3 mL 01/11/2019 Intramuscular   Manufacturer: [place]   Lot: [id number]   [place]: [id number]

## 2019-03-22 ENCOUNTER — Ambulatory Visit: Payer: Medicare Other

## 2019-03-28 ENCOUNTER — Ambulatory Visit: Payer: Medicare Other

## 2019-04-03 ENCOUNTER — Ambulatory Visit: Payer: Medicare Other | Attending: Internal Medicine

## 2019-04-03 DIAGNOSIS — Z23 Encounter for immunization: Secondary | ICD-10-CM | POA: Insufficient documentation

## 2019-04-03 NOTE — Progress Notes (Signed)
   Covid-19 Vaccination Clinic  Name:  Douglas Gaines    MRN: [id number] DOB: 04-27-1950  04/03/2019  Douglas Gaines was observed post Covid-19 immunization for 15 minutes without incident. He was provided with Vaccine Information Sheet and instruction to access the V-Safe system.   Douglas Gaines was instructed to call 911 with any severe reactions post vaccine: Marland Kitchen Difficulty breathing  . Swelling of face and throat  . A fast heartbeat  . A bad rash all over body  . Dizziness and weakness   Immunizations Administered    Name Date Dose VIS Date Route   Pfizer COVID-19 Vaccine 04/03/2019 11:13 AM 0.3 mL 01/11/2019 Intramuscular   Manufacturer: [place]   Lot: [id number]   [place]: [id number]

## 2019-08-05 ENCOUNTER — Other Ambulatory Visit: Payer: Self-pay | Admitting: Family Medicine

## 2019-08-05 DIAGNOSIS — E781 Pure hyperglyceridemia: Secondary | ICD-10-CM

## 2019-08-05 DIAGNOSIS — R7309 Other abnormal glucose: Secondary | ICD-10-CM

## 2019-08-07 ENCOUNTER — Other Ambulatory Visit: Payer: Self-pay

## 2019-08-08 ENCOUNTER — Other Ambulatory Visit (INDEPENDENT_AMBULATORY_CARE_PROVIDER_SITE_OTHER): Payer: Medicare Other

## 2019-08-08 ENCOUNTER — Other Ambulatory Visit: Payer: Self-pay

## 2019-08-08 DIAGNOSIS — R7309 Other abnormal glucose: Secondary | ICD-10-CM

## 2019-08-08 DIAGNOSIS — E781 Pure hyperglyceridemia: Secondary | ICD-10-CM

## 2019-08-08 LAB — COMPREHENSIVE METABOLIC PANEL
ALT: 46 U/L (ref 0–53)
AST: 25 U/L (ref 0–37)
Albumin: 4.6 g/dL (ref 3.5–5.2)
Alkaline Phosphatase: 62 U/L (ref 39–117)
BUN: 22 mg/dL (ref 6–23)
CO2: 28 mEq/L (ref 19–32)
Calcium: 9.4 mg/dL (ref 8.4–10.5)
Chloride: 106 mEq/L (ref 96–112)
Creatinine, Ser: 1.25 mg/dL (ref 0.40–1.50)
GFR: 57.62 mL/min — ABNORMAL LOW (ref >60.00)
Glucose, Bld: 109 mg/dL — ABNORMAL HIGH (ref 70–99)
Potassium: 4 mEq/L (ref 3.5–5.1)
Sodium: 140 mEq/L (ref 135–145)
Total Bilirubin: 0.8 mg/dL (ref 0.2–1.2)
Total Protein: 6.8 g/dL (ref 6.0–8.3)

## 2019-08-08 LAB — LIPID PANEL
Cholesterol: 178 mg/dL (ref 0–200)
HDL: 34.4 mg/dL — ABNORMAL LOW (ref >39.00)
NonHDL: 143.78
Total CHOL/HDL Ratio: 5
Triglycerides: 220 mg/dL — ABNORMAL HIGH (ref 0.0–149.0)
VLDL: 44 mg/dL — ABNORMAL HIGH (ref 0.0–40.0)

## 2019-08-08 LAB — LDL CHOLESTEROL, DIRECT: Direct LDL: 105 mg/dL

## 2019-08-08 LAB — HEMOGLOBIN A1C: Hgb A1c MFr Bld: 5 % (ref 4.6–6.5)

## 2019-08-15 ENCOUNTER — Other Ambulatory Visit: Payer: Self-pay

## 2019-08-15 ENCOUNTER — Ambulatory Visit (INDEPENDENT_AMBULATORY_CARE_PROVIDER_SITE_OTHER): Payer: Medicare Other | Admitting: Family Medicine

## 2019-08-15 ENCOUNTER — Encounter: Payer: Self-pay | Admitting: Family Medicine

## 2019-08-15 VITALS — BP 122/74 | HR 60 | Temp 98.2°F | Ht 71.0 in | Wt 215.0 lb

## 2019-08-15 DIAGNOSIS — N529 Male erectile dysfunction, unspecified: Secondary | ICD-10-CM

## 2019-08-15 DIAGNOSIS — Z7189 Other specified counseling: Secondary | ICD-10-CM

## 2019-08-15 DIAGNOSIS — Z0000 Encounter for general adult medical examination without abnormal findings: Secondary | ICD-10-CM

## 2019-08-15 DIAGNOSIS — E781 Pure hyperglyceridemia: Secondary | ICD-10-CM

## 2019-08-15 DIAGNOSIS — Z136 Encounter for screening for cardiovascular disorders: Secondary | ICD-10-CM

## 2019-08-15 MED ORDER — SILDENAFIL CITRATE 20 MG PO TABS: ORAL_TABLET | ORAL | 12 refills | Status: DC

## 2019-08-15 NOTE — Patient Instructions (Addendum)
Check with your insurance to see if they will cover the shingrix shot. We'll call about AAA screening.  Update me as needed.  Thanks for your effort.  Take care.  Glad to see you.

## 2019-08-15 NOTE — Progress Notes (Signed)
This visit occurred during the SARS-CoV-2 public health emergency.  Safety protocols were in place, including screening questions prior to the visit, additional usage of staff PPE, and extensive cleaning of exam room while observing appropriate contact time as indicated for disinfecting solutions.  I have personally reviewed the Medicare Annual Wellness questionnaire and have noted 1. The patient's medical and social history 2. Their use of alcohol, tobacco or illicit drugs 3. Their current medications and supplements 4. The patient's functional ability including ADL's, fall risks, home safety risks and hearing or visual             impairment. 5. Diet and physical activities 6. Evidence for depression or mood disorders  The patients weight, height, BMI have been recorded in the chart and visual acuity is per eye clinic.  I have made referrals, counseling and provided education to the patient based review of the above and I have provided the pt with a written personalized care plan for preventive services.  Provider list updated- see scanned forms.  Routine anticipatory guidance given to patient.  See health maintenance. The possibility exists that previously documented standard health maintenance information may have been brought forward from a previous encounter into this note.  If needed, that same information has been updated to reflect the current situation based on today's encounter.    His mother recently fell and broke her hip.  She is in SNF after surgery.    Flu done yearly Shingles discussed with patient PNA 2020 Tetanus deferred given his history. covid 2021 Colon cancer screening 2016. Prostate cancer screening and PSA options (with potential risks and benefits of testing vs not testing) were discussed along with recent recs/guidelines.  He declined testing PSA at this point. Advance directive-wife designated if patient were incapacitated.   Cognitive function addressed- see  scanned forms- and if abnormal then additional documentation follows.   AAA screening discussed with patient.  He has neg FH CVD.  Discussed statin.  He'll consider.    Viagra worked with no ADE on med.    Need a refill.  No chest pain.  No nitroglycerin use.  He still sees the skin clinic yearly.   PMH and SH reviewed Meds, vitals, and allergies reviewed.   ROS: Per HPI.  Unless specifically indicated otherwise in HPI, the patient denies:  General: fever. Eyes: acute vision changes ENT: sore throat Cardiovascular: chest pain Respiratory: SOB GI: vomiting GU: dysuria Musculoskeletal: acute back pain Derm: acute rash Neuro: acute motor dysfunction Psych: worsening mood Endocrine: polydipsia Heme: bleeding Allergy: hayfever  GEN: nad, alert and oriented HEENT: ncat NECK: supple w/o LA CV: rrr. PULM: ctab, no inc wob ABD: soft, +bs EXT: no edema SKIN: no acute rash

## 2019-08-18 NOTE — Assessment & Plan Note (Signed)
Flu done yearly Shingles discussed with patient PNA 2020 Tetanus deferred given his history. covid 2021 Colon cancer screening 2016. Prostate cancer screening and PSA options (with potential risks and benefits of testing vs not testing) were discussed along with recent recs/guidelines.  He declined testing PSA at this point. Advance directive-wife designated if patient were incapacitated.   Cognitive function addressed- see scanned forms- and if abnormal then additional documentation follows.   AAA screening discussed with patient.

## 2019-08-18 NOTE — Assessment & Plan Note (Signed)
°  Viagra worked with no ADE on med.    Need a refill.  No chest pain.  No nitroglycerin use.

## 2019-08-18 NOTE — Assessment & Plan Note (Signed)
  He has neg FH CVD.  Discussed statin.  He'll consider.

## 2019-08-18 NOTE — Assessment & Plan Note (Signed)
Advance directive- wife designated if patient were incapacitated.  

## 2019-08-28 ENCOUNTER — Ambulatory Visit
Admission: RE | Admit: 2019-08-28 | Discharge: 2019-08-28 | Disposition: A | Discharge status: Home / Self Care | Payer: Medicare Other | Source: Ambulatory Visit | Attending: Family Medicine | Admitting: Family Medicine

## 2019-08-28 ENCOUNTER — Encounter: Payer: Self-pay | Admitting: Family Medicine

## 2019-08-28 DIAGNOSIS — I77819 Aortic ectasia, unspecified site: Secondary | ICD-10-CM | POA: Insufficient documentation

## 2019-08-28 DIAGNOSIS — Z136 Encounter for screening for cardiovascular disorders: Secondary | ICD-10-CM

## 2020-01-30 ENCOUNTER — Other Ambulatory Visit: Payer: Self-pay | Admitting: Family Medicine

## 2020-01-30 NOTE — Telephone Encounter (Signed)
Please advise 

## 2020-02-02 NOTE — Telephone Encounter (Signed)
Sent. Thanks.   

## 2020-02-14 ENCOUNTER — Other Ambulatory Visit: Payer: Medicare Other

## 2020-02-14 DIAGNOSIS — Z20822 Contact with and (suspected) exposure to covid-19: Secondary | ICD-10-CM

## 2020-02-16 LAB — NOVEL CORONAVIRUS, NAA: SARS-CoV-2, NAA: NOT DETECTED

## 2020-02-16 LAB — SARS-COV-2, NAA 2 DAY TAT

## 2020-08-05 ENCOUNTER — Other Ambulatory Visit: Payer: Self-pay | Admitting: Family Medicine

## 2020-08-05 DIAGNOSIS — E781 Pure hyperglyceridemia: Secondary | ICD-10-CM

## 2020-08-05 DIAGNOSIS — R7309 Other abnormal glucose: Secondary | ICD-10-CM

## 2020-08-07 ENCOUNTER — Other Ambulatory Visit: Payer: Self-pay

## 2020-08-07 ENCOUNTER — Other Ambulatory Visit (INDEPENDENT_AMBULATORY_CARE_PROVIDER_SITE_OTHER): Payer: Medicare Other

## 2020-08-07 DIAGNOSIS — E781 Pure hyperglyceridemia: Secondary | ICD-10-CM

## 2020-08-07 DIAGNOSIS — R7309 Other abnormal glucose: Secondary | ICD-10-CM

## 2020-08-07 LAB — COMPREHENSIVE METABOLIC PANEL
ALT: 51 U/L (ref 0–53)
AST: 24 U/L (ref 0–37)
Albumin: 4.6 g/dL (ref 3.5–5.2)
Alkaline Phosphatase: 61 U/L (ref 39–117)
BUN: 23 mg/dL (ref 6–23)
CO2: 30 mEq/L (ref 19–32)
Calcium: 9.5 mg/dL (ref 8.4–10.5)
Chloride: 105 mEq/L (ref 96–112)
Creatinine, Ser: 1.39 mg/dL (ref 0.40–1.50)
GFR: 52.29 mL/min — ABNORMAL LOW (ref >60.00)
Glucose, Bld: 105 mg/dL — ABNORMAL HIGH (ref 70–99)
Potassium: 4.7 mEq/L (ref 3.5–5.1)
Sodium: 141 mEq/L (ref 135–145)
Total Bilirubin: 0.8 mg/dL (ref 0.2–1.2)
Total Protein: 7.1 g/dL (ref 6.0–8.3)

## 2020-08-07 LAB — LIPID PANEL
Cholesterol: 179 mg/dL (ref 0–200)
HDL: 39.9 mg/dL (ref >39.00)
LDL Cholesterol: 109 mg/dL — ABNORMAL HIGH (ref 0–99)
NonHDL: 139.21
Total CHOL/HDL Ratio: 4
Triglycerides: 153 mg/dL — ABNORMAL HIGH (ref 0.0–149.0)
VLDL: 30.6 mg/dL (ref 0.0–40.0)

## 2020-08-07 LAB — HEMOGLOBIN A1C: Hgb A1c MFr Bld: 5 % (ref 4.6–6.5)

## 2020-08-10 ENCOUNTER — Encounter: Payer: Medicare Other | Admitting: Family Medicine

## 2020-08-14 ENCOUNTER — Encounter: Payer: Medicare Other | Admitting: Family Medicine

## 2020-08-17 ENCOUNTER — Encounter: Payer: Self-pay | Admitting: Family Medicine

## 2020-08-17 ENCOUNTER — Ambulatory Visit (INDEPENDENT_AMBULATORY_CARE_PROVIDER_SITE_OTHER): Payer: Medicare Other | Admitting: Family Medicine

## 2020-08-17 ENCOUNTER — Other Ambulatory Visit: Payer: Self-pay

## 2020-08-17 VITALS — BP 120/82 | HR 62 | Temp 98.1°F | Ht 71.0 in | Wt 212.0 lb

## 2020-08-17 DIAGNOSIS — Z0000 Encounter for general adult medical examination without abnormal findings: Secondary | ICD-10-CM | POA: Diagnosis not present

## 2020-08-17 DIAGNOSIS — E781 Pure hyperglyceridemia: Secondary | ICD-10-CM

## 2020-08-17 DIAGNOSIS — N529 Male erectile dysfunction, unspecified: Secondary | ICD-10-CM

## 2020-08-17 DIAGNOSIS — Z7189 Other specified counseling: Secondary | ICD-10-CM

## 2020-08-17 MED ORDER — SILDENAFIL CITRATE 20 MG PO TABS: 20.0000 mg | ORAL_TABLET | Freq: Every day | ORAL | 12 refills | Status: DC | PRN

## 2020-08-17 NOTE — Progress Notes (Signed)
This visit occurred during the SARS-CoV-2 public health emergency.  Safety protocols were in place, including screening questions prior to the visit, additional usage of staff PPE, and extensive cleaning of exam room while observing appropriate contact time as indicated for disinfecting solutions.  I have personally reviewed the Medicare Annual Wellness questionnaire and have noted 1. The patient's medical and social history 2. Their use of alcohol, tobacco or illicit drugs 3. Their current medications and supplements 4. The patient's functional ability including ADL's, fall risks, home safety risks and hearing or visual             impairment. 5. Diet and physical activities 6. Evidence for depression or mood disorders  The patients weight, height, BMI have been recorded in the chart and visual acuity is per eye clinic.  I have made referrals, counseling and provided education to the patient based review of the above and I have provided the pt with a written personalized care plan for preventive services.  Provider list updated- see scanned forms.  Routine anticipatory guidance given to patient.  See health maintenance. The possibility exists that previously documented standard health maintenance information may have been brought forward from a previous encounter into this note.  If needed, that same information has been updated to reflect the current situation based on today's encounter.    Flu done yearly Shingles discussed with patient PNA 2020 Tetanus deferred given his history. covid 2021 Colon cancer screening 2016. Prostate cancer screening and PSA options (with potential risks and benefits of testing vs not testing) were discussed along with recent recs/guidelines.  He declined testing PSA at this point. Advance directive-wife designated if patient were incapacitated.   Cognitive function addressed- see scanned forms- and if abnormal then additional documentation follows.   D/w pt  about vaccine priority- likely covid booster>shingrix>PNA.     AAA screening done with 2021 with plan to recheck in ~2026.   HLD . He has neg FH CVD.  Discussed statin.  He'll consider.  d/w pt about CT scoring.     Viagra worked with no ADE on med.    Need a refill.  No chest pain.  No nitroglycerin use.   He still sees the skin clinic yearly.   His mother passed away in 11/29/22.  His dog died last week.  Condolences offered.  D/w pt.    PMH and SH reviewed  Meds, vitals, and allergies reviewed.   ROS: Per HPI.  Unless specifically indicated otherwise in HPI, the patient denies:  General: fever. Eyes: acute vision changes ENT: sore throat Cardiovascular: chest pain Respiratory: SOB GI: vomiting GU: dysuria Musculoskeletal: acute back pain Derm: acute rash Neuro: acute motor dysfunction Psych: worsening mood Endocrine: polydipsia Heme: bleeding Allergy: hayfever  GEN: nad, alert and oriented HEENT: ncat NECK: supple w/o LA CV: rrr. PULM: ctab, no inc wob ABD: soft, +bs EXT: no edema SKIN: no acute rash but small nonirritated seb cyst on R upper back.   The 10-year ASCVD risk score Mikey Bussing DC Brooke Bonito., et al., 2013) is: 14.9%   Values used to calculate the score:     Age: 70 years     Sex: Male     Is Non-Hispanic African American: No     Diabetic: No     Tobacco smoker: No     Systolic Blood Pressure: 168 mmHg     Is BP treated: No     HDL Cholesterol: 39.9 mg/dL     Total Cholesterol: 179 mg/dL

## 2020-08-17 NOTE — Patient Instructions (Addendum)
Reasonable to consider calcium scoring to check for heart calcifications.  Let me know if you want me to set that up.  Take care.  Glad to see you.  Let me know if you want me to drain the cyst on your back.

## 2020-08-23 NOTE — Assessment & Plan Note (Signed)
Flu done yearly Shingles discussed with patient PNA 2020 Tetanus deferred given his history. covid 2021 Colon cancer screening 2016. Prostate cancer screening and PSA options (with potential risks and benefits of testing vs not testing) were discussed along with recent recs/guidelines.  He declined testing PSA at this point. Advance directive-wife designated if patient were incapacitated.   Cognitive function addressed- see scanned forms- and if abnormal then additional documentation follows.   D/w pt about vaccine priority- likely covid booster>shingrix>PNA.     AAA screening done with 2021 with plan to recheck in ~2026.

## 2020-08-23 NOTE — Assessment & Plan Note (Signed)
Advance directive- wife designated if patient were incapacitated.  

## 2020-08-23 NOTE — Assessment & Plan Note (Signed)
He has neg FH CVD.  Discussed statin.  He'll consider.  d/w pt about CT scoring.

## 2020-08-23 NOTE — Assessment & Plan Note (Signed)
Viagra worked with no ADE on med.    Need a refill.  No chest pain.  No nitroglycerin use.

## 2020-08-27 ENCOUNTER — Telehealth: Payer: Self-pay | Admitting: Family Medicine

## 2020-08-27 DIAGNOSIS — E785 Hyperlipidemia, unspecified: Secondary | ICD-10-CM

## 2020-08-27 NOTE — Telephone Encounter (Signed)
Mr. Douglas Gaines called in wanted to know about getting the heart calcium scoring done and didn't know what to do.

## 2020-08-28 NOTE — Telephone Encounter (Signed)
Notified patient that referral was done. Patient will await call to schedule.

## 2020-08-28 NOTE — Addendum Note (Signed)
Addended by: Tonia Ghent on: 08/28/2020 07:40 AM   Modules accepted: Orders

## 2020-08-28 NOTE — Telephone Encounter (Signed)
I put in the order and he should get a call about scheduling.  Thanks.

## 2020-09-17 NOTE — Telephone Encounter (Signed)
Douglas Gaines called in wanted to know about the heart scan.

## 2020-09-18 NOTE — Telephone Encounter (Signed)
Spoke with patient and gave him number to call and schedule his CT at [facility] outpatient imaging center. Patient will call and schedule.

## 2020-10-20 ENCOUNTER — Ambulatory Visit
Admission: RE | Admit: 2020-10-20 | Discharge: 2020-10-20 | Disposition: A | Discharge status: Home / Self Care | Payer: Self-pay | Source: Ambulatory Visit | Attending: Family Medicine | Admitting: Family Medicine

## 2020-10-20 ENCOUNTER — Other Ambulatory Visit: Payer: Self-pay

## 2020-10-20 DIAGNOSIS — E785 Hyperlipidemia, unspecified: Secondary | ICD-10-CM

## 2020-10-21 ENCOUNTER — Encounter: Payer: Self-pay | Admitting: Family Medicine

## 2020-10-21 DIAGNOSIS — I2510 Atherosclerotic heart disease of native coronary artery without angina pectoris: Secondary | ICD-10-CM | POA: Insufficient documentation

## 2020-10-22 ENCOUNTER — Other Ambulatory Visit: Payer: Self-pay | Admitting: Family Medicine

## 2020-10-22 DIAGNOSIS — E785 Hyperlipidemia, unspecified: Secondary | ICD-10-CM

## 2020-10-22 MED ORDER — ATORVASTATIN CALCIUM 10 MG PO TABS: 10.0000 mg | ORAL_TABLET | Freq: Every day | ORAL | 3 refills | Status: DC

## 2021-01-19 ENCOUNTER — Other Ambulatory Visit: Payer: Medicare Other

## 2021-01-29 ENCOUNTER — Other Ambulatory Visit: Payer: Self-pay

## 2021-01-29 ENCOUNTER — Other Ambulatory Visit (INDEPENDENT_AMBULATORY_CARE_PROVIDER_SITE_OTHER): Payer: Medicare Other

## 2021-01-29 DIAGNOSIS — E785 Hyperlipidemia, unspecified: Secondary | ICD-10-CM

## 2021-01-29 LAB — HEPATIC FUNCTION PANEL
ALT: 56 U/L — ABNORMAL HIGH (ref 0–53)
AST: 34 U/L (ref 0–37)
Albumin: 4.4 g/dL (ref 3.5–5.2)
Alkaline Phosphatase: 60 U/L (ref 39–117)
Bilirubin, Direct: 0.2 mg/dL (ref 0.0–0.3)
Total Bilirubin: 1 mg/dL (ref 0.2–1.2)
Total Protein: 6.6 g/dL (ref 6.0–8.3)

## 2021-01-29 LAB — LIPID PANEL
Cholesterol: 151 mg/dL (ref 0–200)
HDL: 35.8 mg/dL — ABNORMAL LOW (ref >39.00)
NonHDL: 115.2
Total CHOL/HDL Ratio: 4
Triglycerides: 278 mg/dL — ABNORMAL HIGH (ref 0.0–149.0)
VLDL: 55.6 mg/dL — ABNORMAL HIGH (ref 0.0–40.0)

## 2021-01-29 LAB — LDL CHOLESTEROL, DIRECT: Direct LDL: 77 mg/dL

## 2021-08-04 ENCOUNTER — Other Ambulatory Visit: Payer: Self-pay | Admitting: Family Medicine

## 2021-08-04 DIAGNOSIS — R7309 Other abnormal glucose: Secondary | ICD-10-CM

## 2021-08-04 DIAGNOSIS — E785 Hyperlipidemia, unspecified: Secondary | ICD-10-CM

## 2021-08-06 ENCOUNTER — Other Ambulatory Visit (INDEPENDENT_AMBULATORY_CARE_PROVIDER_SITE_OTHER): Payer: Medicare Other

## 2021-08-06 DIAGNOSIS — R7309 Other abnormal glucose: Secondary | ICD-10-CM | POA: Diagnosis not present

## 2021-08-06 DIAGNOSIS — E785 Hyperlipidemia, unspecified: Secondary | ICD-10-CM | POA: Diagnosis not present

## 2021-08-06 LAB — COMPREHENSIVE METABOLIC PANEL
ALT: 59 U/L — ABNORMAL HIGH (ref 0–53)
AST: 33 U/L (ref 0–37)
Albumin: 4.6 g/dL (ref 3.5–5.2)
Alkaline Phosphatase: 70 U/L (ref 39–117)
BUN: 20 mg/dL (ref 6–23)
CO2: 28 mEq/L (ref 19–32)
Calcium: 9.6 mg/dL (ref 8.4–10.5)
Chloride: 104 mEq/L (ref 96–112)
Creatinine, Ser: 1.18 mg/dL (ref 0.40–1.50)
GFR: 63.2 mL/min (ref >60.00)
Glucose, Bld: 107 mg/dL — ABNORMAL HIGH (ref 70–99)
Potassium: 4.6 mEq/L (ref 3.5–5.1)
Sodium: 140 mEq/L (ref 135–145)
Total Bilirubin: 1 mg/dL (ref 0.2–1.2)
Total Protein: 6.6 g/dL (ref 6.0–8.3)

## 2021-08-06 LAB — LIPID PANEL
Cholesterol: 132 mg/dL (ref 0–200)
HDL: 36.1 mg/dL — ABNORMAL LOW (ref >39.00)
NonHDL: 96.37
Total CHOL/HDL Ratio: 4
Triglycerides: 235 mg/dL — ABNORMAL HIGH (ref 0.0–149.0)
VLDL: 47 mg/dL — ABNORMAL HIGH (ref 0.0–40.0)

## 2021-08-06 LAB — HEMOGLOBIN A1C: Hgb A1c MFr Bld: 5.3 % (ref 4.6–6.5)

## 2021-08-06 LAB — LDL CHOLESTEROL, DIRECT: Direct LDL: 64 mg/dL

## 2021-08-09 ENCOUNTER — Other Ambulatory Visit: Payer: Medicare Other

## 2021-08-16 ENCOUNTER — Ambulatory Visit (INDEPENDENT_AMBULATORY_CARE_PROVIDER_SITE_OTHER): Payer: Medicare Other | Admitting: Family Medicine

## 2021-08-16 ENCOUNTER — Encounter: Payer: Self-pay | Admitting: Family Medicine

## 2021-08-16 VITALS — BP 116/68 | HR 57 | Temp 97.7°F | Ht 71.0 in | Wt 216.0 lb

## 2021-08-16 DIAGNOSIS — Z125 Encounter for screening for malignant neoplasm of prostate: Secondary | ICD-10-CM

## 2021-08-16 DIAGNOSIS — Z0000 Encounter for general adult medical examination without abnormal findings: Secondary | ICD-10-CM

## 2021-08-16 DIAGNOSIS — Z7189 Other specified counseling: Secondary | ICD-10-CM

## 2021-08-16 DIAGNOSIS — I2510 Atherosclerotic heart disease of native coronary artery without angina pectoris: Secondary | ICD-10-CM

## 2021-08-16 LAB — PSA, MEDICARE: PSA: 0.82 ng/ml (ref 0.10–4.00)

## 2021-08-16 MED ORDER — ATORVASTATIN CALCIUM 10 MG PO TABS
10.0000 mg | ORAL_TABLET | Freq: Every day | ORAL | 3 refills | Status: DC
Start: 2021-08-16 — End: 2022-09-06

## 2021-08-16 MED ORDER — SILDENAFIL CITRATE 20 MG PO TABS
20.0000 mg | ORAL_TABLET | Freq: Every day | ORAL | 12 refills | Status: DC | PRN
Start: 2021-08-16 — End: 2022-08-25

## 2021-08-16 NOTE — Patient Instructions (Signed)
Go to the lab on the way out.   If you have mychart we'll likely use that to update you.    Take care.  Glad to see you. 

## 2021-08-16 NOTE — Progress Notes (Unsigned)
I have personally reviewed the Medicare Annual Wellness questionnaire and have noted 1. The patient's medical and social history 2. Their use of alcohol, tobacco or illicit drugs 3. Their current medications and supplements 4. The patient's functional ability including ADL's, fall risks, home safety risks and hearing or visual             impairment. 5. Diet and physical activities 6. Evidence for depression or mood disorders  The patients weight, height, BMI have been recorded in the chart and visual acuity is per eye clinic.  I have made referrals, counseling and provided education to the patient based review of the above and I have provided the pt with a written personalized care plan for preventive services.  Provider list updated- see scanned forms.  Routine anticipatory guidance given to patient.  See health maintenance. The possibility exists that previously documented standard health maintenance information may have been brought forward from a previous encounter into this note.  If needed, that same information has been updated to reflect the current situation based on today's encounter.    Flu Shingles PNA Tetanus Colon  Breast cancer screening Prostate cancer screening- he has some LUTS with occ starting and stopping.  Sx are not consistent/everyday.   Advance directive Cognitive function addressed- see scanned forms- and if abnormal then additional documentation follows.  Aorta f/u due in 2026.   In addition to [facility] Wellness, follow up visit for the below conditions:  Elevated Cholesterol: Using medications without problems: yes Muscle aches: no Diet compliance: yes Exercise: yes  CAD d/w pt.  Prev scoring d/w pt.  No CP, SOB, BLE edema.    He is still seeing dermatology.    PMH and SH reviewed  Meds, vitals, and allergies reviewed.   ROS: Per HPI.  Unless specifically indicated otherwise in HPI, the patient denies:  General: fever. Eyes: acute vision  changes ENT: sore throat Cardiovascular: chest pain Respiratory: SOB GI: vomiting GU: dysuria Musculoskeletal: acute back pain Derm: acute rash Neuro: acute motor dysfunction Psych: worsening mood Endocrine: polydipsia Heme: bleeding Allergy: hayfever  GEN: nad, alert and oriented HEENT: ncat NECK: supple w/o LA CV: rrr. PULM: ctab, no inc wob ABD: soft, +bs EXT: no edema SKIN: no acute rash

## 2021-08-18 NOTE — Assessment & Plan Note (Signed)
No chest pain.  Active.  Would continue statin.

## 2021-08-18 NOTE — Assessment & Plan Note (Signed)
Flu previously done Shingles done at pharmacy PNA done at pharmacy Tetanus deferred. COVID-vaccine previously done Colon 2016 Prostate cancer screening- he has some LUTS with occ starting and stopping.  Sx are not consistent/everyday.   Advance directive-wife designated if patient were incapacitated. Cognitive function addressed- see scanned forms- and if abnormal then additional documentation follows.  Aorta f/u due in 2026.

## 2021-08-18 NOTE — Assessment & Plan Note (Signed)
Wife designated if patient were incapacitated.  

## 2021-08-25 ENCOUNTER — Telehealth: Payer: Self-pay | Admitting: Family Medicine

## 2021-08-25 DIAGNOSIS — Z111 Encounter for screening for respiratory tuberculosis: Secondary | ICD-10-CM

## 2021-08-25 NOTE — Telephone Encounter (Signed)
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 Has to drop off a health certificate form tomorrow for Dr. Damita Dunnings to fill. Like to get the TB test done as soon as people. Call back number [phone].

## 2021-08-25 NOTE — Telephone Encounter (Signed)
Spoke with patient to see if he needed an skin TB test or blood draw. Patient needs skin TB test done. Are you okay with patient having this done?

## 2021-08-25 NOTE — Telephone Encounter (Signed)
Patient has been scheduled for lab visit tomorrow. Will drop off form then.

## 2021-08-25 NOTE — Telephone Encounter (Signed)
I put in the order for a blood draw to get the TB test done.  See if he can get that scheduled as a lab visit and drop off the form at the same time.  I will work on it as quickly as I can.  I appreciate him coaching.  Thanks.

## 2021-08-26 ENCOUNTER — Other Ambulatory Visit (INDEPENDENT_AMBULATORY_CARE_PROVIDER_SITE_OTHER): Payer: Medicare Other

## 2021-08-26 DIAGNOSIS — Z111 Encounter for screening for respiratory tuberculosis: Secondary | ICD-10-CM | POA: Diagnosis not present

## 2021-08-30 LAB — QUANTIFERON-TB GOLD PLUS
Mitogen-NIL: >10 IU/mL
NIL: 0.03 IU/mL
QuantiFERON-TB Gold Plus: NEGATIVE
TB1-NIL: 0.01 IU/mL
TB2-NIL: 0 IU/mL

## 2021-09-01 ENCOUNTER — Encounter: Payer: Self-pay | Admitting: Family Medicine

## 2021-11-04 ENCOUNTER — Ambulatory Visit: Payer: Medicare Other | Admitting: Family Medicine

## 2021-11-04 ENCOUNTER — Encounter: Payer: Self-pay | Admitting: Family Medicine

## 2021-11-04 VITALS — BP 150/70 | HR 61 | Temp 97.4°F | Ht 71.0 in | Wt 222.0 lb

## 2021-11-04 DIAGNOSIS — J020 Streptococcal pharyngitis: Secondary | ICD-10-CM | POA: Diagnosis not present

## 2021-11-04 DIAGNOSIS — J029 Acute pharyngitis, unspecified: Secondary | ICD-10-CM | POA: Diagnosis not present

## 2021-11-04 LAB — POCT RAPID STREP A (OFFICE): Rapid Strep A Screen: POSITIVE — AB

## 2021-11-04 MED ORDER — AMOXICILLIN 875 MG PO TABS: 875.0000 mg | ORAL_TABLET | Freq: Two times a day (BID) | ORAL | 0 refills | Status: AC

## 2021-11-04 NOTE — Patient Instructions (Addendum)
Start amoxil and you should get better.  Take care.  Glad to see you. Rest and fluids, gargle with salt water.

## 2021-11-04 NOTE — Progress Notes (Signed)
duration of symptoms: about 1 week ago.  rhinorrhea:no congestion: no ear pain: minimal, occ.   sore throat: yes cough: no myalgias: no No fevers.   No vomiting.   Prev hoarse voice, but that is getting better.    Per HPI unless specifically indicated in ROS section   Meds, vitals, and allergies reviewed.   GEN: nad, alert and oriented HEENT: mucous membranes moist, TM w/o erythema, OP with cobblestoning NECK: supple w/o LA CV: rrr. PULM: ctab, no inc wob EXT: no edema  RST positive.    He had neg covid test this AM at home.    He had a flu shot 10/27/21

## 2021-11-07 DIAGNOSIS — J020 Streptococcal pharyngitis: Secondary | ICD-10-CM | POA: Insufficient documentation

## 2021-11-07 NOTE — Assessment & Plan Note (Signed)
Rest, fluids, Amoxil, gargle salt water, supportive care.  He will update me as needed.

## 2022-08-11 ENCOUNTER — Ambulatory Visit (INDEPENDENT_AMBULATORY_CARE_PROVIDER_SITE_OTHER): Payer: Medicare Other

## 2022-08-11 VITALS — Ht 71.0 in | Wt 215.0 lb

## 2022-08-11 DIAGNOSIS — Z0000 Encounter for general adult medical examination without abnormal findings: Secondary | ICD-10-CM

## 2022-08-11 NOTE — Progress Notes (Signed)
Subjective:   Douglas Gaines is a 72 y.o. male who presents for Medicare Annual/Subsequent preventive examination.  Visit Complete: Virtual  I connected with  Douglas Gaines on 08/11/22 by a audio enabled telemedicine application and verified that I am speaking with the correct person using two identifiers.  Patient Location: Home  Provider Location: Office/Clinic  I discussed the limitations of evaluation and management by telemedicine. The patient expressed understanding and agreed to proceed.   Review of Systems      Cardiac Risk Factors include: advanced age (>71men, >34 women);male gender;dyslipidemia     Objective:    Today's Vitals   08/11/22 1402  Weight: 215 lb (97.5 kg)  Height: 5\' 11"  (1.803 m)   Body mass index is 29.99 kg/m.     08/11/2022    2:07 PM  Advanced Directives  Does Patient Have a Medical Advance Directive? No  Would patient like information on creating a medical advance directive? No - Patient declined    Current Medications (verified) Outpatient Encounter Medications as of 08/11/2022  Medication Sig   atorvastatin (LIPITOR) 10 MG tablet Take 1 tablet (10 mg total) by mouth daily.   Multiple Vitamins-Minerals (CENTRUM SILVER 50+MEN) TABS    niacinamide 500 MG tablet    Omega-3 Fatty Acids (FISH OIL) 500 MG CAPS Take by mouth.   sildenafil (REVATIO) 20 MG tablet Take 1 tablet (20 mg total) by mouth daily as needed.   No facility-administered encounter medications on file as of 08/11/2022.    Allergies (verified) Tetanus toxoid   History: Past Medical History:  Diagnosis Date   Melanoma (HCC) 2010   Plantar fasciitis 2011   left   Ureteral stone 05/98   IVP Distal Right   Past Surgical History:  Procedure Laterality Date   MELANOMA EXCISION  02/07/08   MCHS Right Ant Flank with Neg Sentinel Nodes [facility]- followed by Dr. Yetta Barre with derm   Family History  Problem Relation Age of Onset   Cancer Mother        Skin- BCC and SCC    Hyperlipidemia Father    Bladder Cancer Father    Colon polyps Father    Cancer Brother        lung and bone cancer   Colon cancer Neg Hx    Prostate cancer Neg Hx    Social History   Socioeconomic History   Marital status: Married    Spouse name: Not on file   Number of children: 3   Years of education: Not on file   Highest education level: Not on file  Occupational History   Occupation: Psychologist, educational    Comment: Retired   Occupation: HS Football Coach--[employer]  Tobacco Use   Smoking status: Former    Current packs/day: 0.00    Types: Cigarettes    Quit date: 02/01/1988    Years since quitting: 34.5   Smokeless tobacco: Never  Substance and Sexual Activity   Alcohol use: No    Alcohol/week: 0.0 standard drinks of alcohol   Drug use: No   Sexual activity: Not on file  Other Topics Concern   Not on file  Social History Narrative   Prev coached football at [employer], coached at [employer]   Married 1978   3 kids, 1 is a family doctor, 1 is a Runner, broadcasting/film/video and 1 is a Engineer, civil (consulting)     6 grandkids as of 2022   Former IT sales professional   Social Determinants of Health  Financial Resource Strain: Low Risk  (08/11/2022)   Overall Financial Resource Strain (CARDIA)    Difficulty of Paying Living Expenses: Not hard at all  Food Insecurity: No Food Insecurity (08/11/2022)   Hunger Vital Sign    Worried About Running Out of Food in the Last Year: Never true    Ran Out of Food in the Last Year: Never true  Transportation Needs: No Transportation Needs (08/11/2022)   PRAPARE - Administrator, Civil Service (Medical): No    Lack of Transportation (Non-Medical): No  Physical Activity: Sufficiently Active (08/11/2022)   Exercise Vital Sign    Days of Exercise per Week: 5 days    Minutes of Exercise per Session: 30 min  Stress: No Stress Concern Present (08/11/2022)   [employer] of Occupational Health - Occupational Stress Questionnaire    Feeling of Stress  : Not at all  Social Connections: Moderately Integrated (08/11/2022)   Social Connection and Isolation Panel [NHANES]    Frequency of Communication with Friends and Family: More than three times a week    Frequency of Social Gatherings with Friends and Family: More than three times a week    Attends Religious Services: More than 4 times per year    Active Member of [employer] or Organizations: No    Attends Engineer, structural: Never    Marital Status: Married    Tobacco Counseling Counseling given: Not Answered   Clinical Intake:  Pre-visit preparation completed: Yes  Pain : No/denies pain     BMI - recorded: 29.99 Nutritional Status: BMI 25 -29 Overweight Nutritional Risks: None Diabetes: No  How often do you need to have someone help you when you read instructions, pamphlets, or other written materials from your doctor or pharmacy?: 1 - Never  Interpreter Needed?: No  Information entered by :: C.Airiel Oblinger LPN   Activities of Daily Living    08/11/2022    2:08 PM  In your present state of health, do you have any difficulty performing the following activities:  Hearing? 0  Vision? 0  Difficulty concentrating or making decisions? 0  Walking or climbing stairs? 0  Dressing or bathing? 0  Doing errands, shopping? 0  Preparing Food and eating ? N  Using the Toilet? N  In the past six months, have you accidently leaked urine? N  Do you have problems with loss of bowel control? N  Managing your Medications? N  Managing your Finances? N  Housekeeping or managing your Housekeeping? N    Patient Care Team: Joaquim Nam, MD as PCP - General (Family Medicine)  Indicate any recent Medical Services you may have received from other than [facility] providers in the past year (date may be approximate).     Assessment:   This is a routine wellness examination for Douglas Gaines.  Hearing/Vision screen Hearing Screening - Comments:: No hearing issues Vision Screening -  Comments:: Readers - UTD on eye exams - Dr.Gould - has appt. Coming up  Dietary issues and exercise activities discussed:     Goals Addressed             This Visit's Progress    Patient Stated       Continue to exercise and be active       Depression Screen    08/11/2022    2:06 PM 08/17/2020    9:44 AM 08/15/2019    9:43 AM 08/13/2018   12:16 PM 05/29/2017    3:10 PM  PHQ  2/9 Scores  PHQ - 2 Score 0 0 0 0 0    Fall Risk    08/11/2022    2:04 PM 08/17/2020    9:44 AM 08/15/2019    9:43 AM 08/13/2018   12:16 PM  Fall Risk   Falls in the past year? 0 0 0 0  Number falls in past yr: 0 0 0   Injury with Fall? 0 0 0   Risk for fall due to : No Fall Risks     Follow up Falls prevention discussed;Falls evaluation completed Falls evaluation completed      MEDICARE RISK AT HOME:  Medicare Risk at Home - 08/11/22 1408     Any stairs in or around the home? Yes    If so, are there any without handrails? No    Home free of loose throw rugs in walkways, pet beds, electrical cords, etc? Yes    Adequate lighting in your home to reduce risk of falls? Yes    Life alert? No    Use of a cane, walker or w/c? No    Grab bars in the bathroom? No    Shower chair or bench in shower? Yes    Elevated toilet seat or a handicapped toilet? Yes             TIMED UP AND GO:  Was the test performed?  No    Cognitive Function:        08/11/2022    2:09 PM  6CIT Screen  What Year? 0 points  What month? 0 points  What time? 0 points  Count back from 20 0 points  Months in reverse 0 points  Repeat phrase 0 points  Total Score 0 points    Immunizations Immunization History  Administered Date(s) Administered   Influenza-Unspecified 10/31/2017, 10/27/2021   PFIZER(Purple Top)SARS-COV-2 Vaccination 03/09/2019, 04/03/2019, 10/02/2019   PNEUMOCOCCAL CONJUGATE-20 03/03/2021   Pneumococcal Polysaccharide-23 08/13/2018   Td 02/01/1999   Zoster Recombinant(Shingrix) 03/03/2021,  05/01/2021   Zoster, Live 04/25/2013    TDAP status: Due, Education has been provided regarding the importance of this vaccine. Advised may receive this vaccine at local pharmacy or Health Dept. Aware to provide a copy of the vaccination record if obtained from local pharmacy or Health Dept. Verbalized acceptance and understanding.  Flu Vaccine status: Up to date  Pneumococcal vaccine status: Up to date  Covid-19 vaccine status: Information provided on how to obtain vaccines.   Qualifies for Shingles Vaccine? Yes   Zostavax completed Yes   Shingrix Completed?: Yes  Screening Tests Health Maintenance  Topic Date Due   DTaP/Tdap/Td (2 - Tdap) 01/31/2009   COVID-19 Vaccine (4 - 2023-24 season) 10/01/2021   Medicare Annual Wellness (AWV)  08/17/2022   INFLUENZA VACCINE  09/01/2022   Colonoscopy  02/27/2024   Pneumonia Vaccine 36+ Years old  Completed   Hepatitis C Screening  Completed   Zoster Vaccines- Shingrix  Completed   HPV VACCINES  Aged Out    Health Maintenance  Health Maintenance Due  Topic Date Due   DTaP/Tdap/Td (2 - Tdap) 01/31/2009   COVID-19 Vaccine (4 - 2023-24 season) 10/01/2021   Medicare Annual Wellness (AWV)  08/17/2022    Colorectal cancer screening: Type of screening: Colonoscopy. Completed 02/26/14. Repeat every 10 years  Lung Cancer Screening: (Low Dose CT Chest recommended if Age 76-80 years, 20 pack-year currently smoking OR have quit w/in 15years.) does not qualify.   Lung Cancer Screening Referral: n/a  Additional Screening:  Hepatitis C Screening: does qualify; Completed 05/05/16  Vision Screening: Recommended annual ophthalmology exams for early detection of glaucoma and other disorders of the eye. Is the patient up to date with their annual eye exam?  Yes  Who is the provider or what is the name of the office in which the patient attends annual eye exams? Dr.Gould If pt is not established with a provider, would they like to be referred to  a provider to establish care? Yes .   Dental Screening: Recommended annual dental exams for proper oral hygiene    Community Resource Referral / Chronic Care Management: CRR required this visit?  No   CCM required this visit?  No     Plan:     I have personally reviewed and noted the following in the patient's chart:   Medical and social history Use of alcohol, tobacco or illicit drugs  Current medications and supplements including opioid prescriptions. Patient is not currently taking opioid prescriptions. Functional ability and status Nutritional status Physical activity Advanced directives List of other physicians Hospitalizations, surgeries, and ER visits in previous 12 months Vitals Screenings to include cognitive, depression, and falls Referrals and appointments  In addition, I have reviewed and discussed with patient certain preventive protocols, quality metrics, and best practice recommendations. A written personalized care plan for preventive services as well as general preventive health recommendations were provided to patient.     Maryan Puls, LPN   1/61/0960   After Visit Summary: (MyChart) Due to this being a telephonic visit, the after visit summary with patients personalized plan was offered to patient via MyChart   Nurse Notes: none

## 2022-08-11 NOTE — Patient Instructions (Signed)
Douglas Gaines , Thank you for taking time to come for your Medicare Wellness Visit. I appreciate your ongoing commitment to your health goals. Please review the following plan we discussed and let me know if I can assist you in the future.   These are the goals we discussed:  Goals      Patient Stated     Continue to exercise and be active        This is a list of the screening recommended for you and due dates:  Health Maintenance  Topic Date Due   DTaP/Tdap/Td vaccine (2 - Tdap) 01/31/2009   COVID-19 Vaccine (4 - 2023-24 season) 10/01/2021   Medicare Annual Wellness Visit  08/17/2022   Flu Shot  09/01/2022   Colon Cancer Screening  02/27/2024   Pneumonia Vaccine  Completed   Hepatitis C Screening  Completed   Zoster (Shingles) Vaccine  Completed   HPV Vaccine  Aged Out    Advanced directives: Advance directive discussed with you today. Even though you declined this today, please call our office should you change your mind, and we can give you the proper paperwork for you to fill out.   Conditions/risks identified: none  Next appointment: Follow up in one year for your annual wellness visit. 08/16/23 @ 3pm telephone call  Preventive Care 65 Years and Older, Male  Preventive care refers to lifestyle choices and visits with your health care provider that can promote health and wellness. What does preventive care include? A yearly physical exam. This is also called an annual well check. Dental exams once or twice a year. Routine eye exams. Ask your health care provider how often you should have your eyes checked. Personal lifestyle choices, including: Daily care of your teeth and gums. Regular physical activity. Eating a healthy diet. Avoiding tobacco and drug use. Limiting alcohol use. Practicing safe sex. Taking low doses of aspirin every day. Taking vitamin and mineral supplements as recommended by your health care provider. What happens during an annual well check? The  services and screenings done by your health care provider during your annual well check will depend on your age, overall health, lifestyle risk factors, and family history of disease. Counseling  Your health care provider may ask you questions about your: Alcohol use. Tobacco use. Drug use. Emotional well-being. Home and relationship well-being. Sexual activity. Eating habits. History of falls. Memory and ability to understand (cognition). Work and work Astronomer. Screening  You may have the following tests or measurements: Height, weight, and BMI. Blood pressure. Lipid and cholesterol levels. These may be checked every 5 years, or more frequently if you are over 46 years old. Skin check. Lung cancer screening. You may have this screening every year starting at age 66 if you have a 30-pack-year history of smoking and currently smoke or have quit within the past 15 years. Fecal occult blood test (FOBT) of the stool. You may have this test every year starting at age 57. Flexible sigmoidoscopy or colonoscopy. You may have a sigmoidoscopy every 5 years or a colonoscopy every 10 years starting at age 38. Prostate cancer screening. Recommendations will vary depending on your family history and other risks. Hepatitis C blood test. Hepatitis B blood test. Sexually transmitted disease (STD) testing. Diabetes screening. This is done by checking your blood sugar (glucose) after you have not eaten for a while (fasting). You may have this done every 1-3 years. Abdominal aortic aneurysm (AAA) screening. You may need this if you are a  current or former smoker. Osteoporosis. You may be screened starting at age 73 if you are at high risk. Talk with your health care provider about your test results, treatment options, and if necessary, the need for more tests. Vaccines  Your health care provider may recommend certain vaccines, such as: Influenza vaccine. This is recommended every year. Tetanus,  diphtheria, and acellular pertussis (Tdap, Td) vaccine. You may need a Td booster every 10 years. Zoster vaccine. You may need this after age 43. Pneumococcal 13-valent conjugate (PCV13) vaccine. One dose is recommended after age 77. Pneumococcal polysaccharide (PPSV23) vaccine. One dose is recommended after age 8. Talk to your health care provider about which screenings and vaccines you need and how often you need them. This information is not intended to replace advice given to you by your health care provider. Make sure you discuss any questions you have with your health care provider. Document Released: 02/13/2015 Document Revised: 10/07/2015 Document Reviewed: 11/18/2014 Elsevier Interactive Patient Education  2017 [employer].  Fall Prevention in the Home Falls can cause injuries. They can happen to people of all ages. There are many things you can do to make your home safe and to help prevent falls. What can I do on the outside of my home? Regularly fix the edges of walkways and driveways and fix any cracks. Remove anything that might make you trip as you walk through a door, such as a raised step or threshold. Trim any bushes or trees on the path to your home. Use bright outdoor lighting. Clear any walking paths of anything that might make someone trip, such as rocks or tools. Regularly check to see if handrails are loose or broken. Make sure that both sides of any steps have handrails. Any raised decks and porches should have guardrails on the edges. Have any leaves, snow, or ice cleared regularly. Use sand or salt on walking paths during winter. Clean up any spills in your garage right away. This includes oil or grease spills. What can I do in the bathroom? Use night lights. Install grab bars by the toilet and in the tub and shower. Do not use towel bars as grab bars. Use non-skid mats or decals in the tub or shower. If you need to sit down in the shower, use a plastic,  non-slip stool. Keep the floor dry. Clean up any water that spills on the floor as soon as it happens. Remove soap buildup in the tub or shower regularly. Attach bath mats securely with double-sided non-slip rug tape. Do not have throw rugs and other things on the floor that can make you trip. What can I do in the bedroom? Use night lights. Make sure that you have a light by your bed that is easy to reach. Do not use any sheets or blankets that are too big for your bed. They should not hang down onto the floor. Have a firm chair that has side arms. You can use this for support while you get dressed. Do not have throw rugs and other things on the floor that can make you trip. What can I do in the kitchen? Clean up any spills right away. Avoid walking on wet floors. Keep items that you use a lot in easy-to-reach places. If you need to reach something above you, use a strong step stool that has a grab bar. Keep electrical cords out of the way. Do not use floor polish or wax that makes floors slippery. If you must use  wax, use non-skid floor wax. Do not have throw rugs and other things on the floor that can make you trip. What can I do with my stairs? Do not leave any items on the stairs. Make sure that there are handrails on both sides of the stairs and use them. Fix handrails that are broken or loose. Make sure that handrails are as long as the stairways. Check any carpeting to make sure that it is firmly attached to the stairs. Fix any carpet that is loose or worn. Avoid having throw rugs at the top or bottom of the stairs. If you do have throw rugs, attach them to the floor with carpet tape. Make sure that you have a light switch at the top of the stairs and the bottom of the stairs. If you do not have them, ask someone to add them for you. What else can I do to help prevent falls? Wear shoes that: Do not have high heels. Have rubber bottoms. Are comfortable and fit you well. Are closed  at the toe. Do not wear sandals. If you use a stepladder: Make sure that it is fully opened. Do not climb a closed stepladder. Make sure that both sides of the stepladder are locked into place. Ask someone to hold it for you, if possible. Clearly mark and make sure that you can see: Any grab bars or handrails. First and last steps. Where the edge of each step is. Use tools that help you move around (mobility aids) if they are needed. These include: Canes. Walkers. Scooters. Crutches. Turn on the lights when you go into a dark area. Replace any light bulbs as soon as they burn out. Set up your furniture so you have a clear path. Avoid moving your furniture around. If any of your floors are uneven, fix them. If there are any pets around you, be aware of where they are. Review your medicines with your doctor. Some medicines can make you feel dizzy. This can increase your chance of falling. Ask your doctor what other things that you can do to help prevent falls. This information is not intended to replace advice given to you by your health care provider. Make sure you discuss any questions you have with your health care provider. Document Released: 11/13/2008 Document Revised: 06/25/2015 Document Reviewed: 02/21/2014 Elsevier Interactive Patient Education  2017 [employer].

## 2022-08-21 ENCOUNTER — Other Ambulatory Visit: Payer: Self-pay | Admitting: Family Medicine

## 2022-08-21 DIAGNOSIS — Z125 Encounter for screening for malignant neoplasm of prostate: Secondary | ICD-10-CM

## 2022-08-21 DIAGNOSIS — R7309 Other abnormal glucose: Secondary | ICD-10-CM

## 2022-08-21 DIAGNOSIS — E785 Hyperlipidemia, unspecified: Secondary | ICD-10-CM

## 2022-08-25 ENCOUNTER — Other Ambulatory Visit: Payer: Self-pay | Admitting: Family Medicine

## 2022-08-30 ENCOUNTER — Other Ambulatory Visit (INDEPENDENT_AMBULATORY_CARE_PROVIDER_SITE_OTHER): Payer: Medicare Other

## 2022-08-30 DIAGNOSIS — E785 Hyperlipidemia, unspecified: Secondary | ICD-10-CM

## 2022-08-30 DIAGNOSIS — Z125 Encounter for screening for malignant neoplasm of prostate: Secondary | ICD-10-CM

## 2022-08-30 DIAGNOSIS — R7309 Other abnormal glucose: Secondary | ICD-10-CM | POA: Diagnosis not present

## 2022-08-30 LAB — LDL CHOLESTEROL, DIRECT: Direct LDL: 56 mg/dL

## 2022-08-30 LAB — COMPREHENSIVE METABOLIC PANEL
ALT: 63 U/L — ABNORMAL HIGH (ref 0–53)
AST: 35 U/L (ref 0–37)
Albumin: 4.6 g/dL (ref 3.5–5.2)
Alkaline Phosphatase: 71 U/L (ref 39–117)
BUN: 19 mg/dL (ref 6–23)
CO2: 28 mEq/L (ref 19–32)
Calcium: 10.1 mg/dL (ref 8.4–10.5)
Chloride: 105 mEq/L (ref 96–112)
Creatinine, Ser: 1.14 mg/dL (ref 0.40–1.50)
GFR: 65.38 mL/min (ref >60.00)
Glucose, Bld: 103 mg/dL — ABNORMAL HIGH (ref 70–99)
Potassium: 4.1 mEq/L (ref 3.5–5.1)
Sodium: 140 mEq/L (ref 135–145)
Total Bilirubin: 1 mg/dL (ref 0.2–1.2)
Total Protein: 6.6 g/dL (ref 6.0–8.3)

## 2022-08-30 LAB — LIPID PANEL
Cholesterol: 128 mg/dL (ref 0–200)
HDL: 37.7 mg/dL — ABNORMAL LOW (ref >39.00)
NonHDL: 90.16
Total CHOL/HDL Ratio: 3
Triglycerides: 234 mg/dL — ABNORMAL HIGH (ref 0.0–149.0)
VLDL: 46.8 mg/dL — ABNORMAL HIGH (ref 0.0–40.0)

## 2022-08-30 LAB — PSA, MEDICARE: PSA: 1.04 ng/ml (ref 0.10–4.00)

## 2022-08-30 LAB — HEMOGLOBIN A1C: Hgb A1c MFr Bld: 5.2 % (ref 4.6–6.5)

## 2022-09-06 ENCOUNTER — Encounter: Payer: Self-pay | Admitting: Family Medicine

## 2022-09-06 ENCOUNTER — Ambulatory Visit (INDEPENDENT_AMBULATORY_CARE_PROVIDER_SITE_OTHER): Payer: Medicare Other | Admitting: Family Medicine

## 2022-09-06 VITALS — BP 116/68 | HR 62 | Temp 97.9°F | Ht 71.0 in | Wt 217.0 lb

## 2022-09-06 DIAGNOSIS — N529 Male erectile dysfunction, unspecified: Secondary | ICD-10-CM | POA: Diagnosis not present

## 2022-09-06 DIAGNOSIS — I2510 Atherosclerotic heart disease of native coronary artery without angina pectoris: Secondary | ICD-10-CM

## 2022-09-06 DIAGNOSIS — Z7189 Other specified counseling: Secondary | ICD-10-CM

## 2022-09-06 DIAGNOSIS — Z0000 Encounter for general adult medical examination without abnormal findings: Secondary | ICD-10-CM

## 2022-09-06 MED ORDER — ATORVASTATIN CALCIUM 10 MG PO TABS: 10.0000 mg | ORAL_TABLET | Freq: Every day | ORAL | 3 refills | Status: DC

## 2022-09-06 MED ORDER — SILDENAFIL CITRATE 20 MG PO TABS: 20.0000 mg | ORAL_TABLET | Freq: Every day | ORAL | 12 refills | Status: DC | PRN

## 2022-09-06 NOTE — Progress Notes (Unsigned)
Flu previously done Shingles done at pharmacy PNA done at pharmacy Tetanus deferred. COVID-vaccine previously done Colon 2016 Prostate cancer screening- he has some LUTS with occ starting and stopping.  Sx are not consistent/everyday.   Advance directive-wife designated if patient were incapacitated. Aorta f/u due in 2026.   His wife's sister passed this year, d/w pt.  Condolences offered.     CAD d/w pt.  Prev scoring d/w pt.  No CP, SOB, BLE edema.   Using medications without problems: yes Muscle aches: no Diet compliance: yes Exercise: yes  ED improved on sildenafil.  No ADE on med.  No NTG use.    Minimal ALT elevation similar to prev.  No abd pain, no black stools or bloating.   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     He is still seeing dermatology.     PMH and SH reviewed   Meds, vitals, and allergies reviewed.    ROS: Per HPI.  Unless specifically indicated otherwise in HPI, the patient denies:   GEN: nad, alert and oriented HEENT: ncat NECK: supple w/o LA CV: rrr. PULM: ctab, no inc wob ABD: soft, +bs EXT: no edema SKIN: no acute rash

## 2022-09-06 NOTE — Patient Instructions (Signed)
Take care.  Glad to see you. Don't change your meds for now.  Thanks for your effort.

## 2022-09-07 DIAGNOSIS — Z0000 Encounter for general adult medical examination without abnormal findings: Secondary | ICD-10-CM | POA: Insufficient documentation

## 2022-09-07 NOTE — Assessment & Plan Note (Signed)
ED improved on sildenafil.  No ADE on med.  No NTG use.   Continue as is

## 2022-09-07 NOTE — Assessment & Plan Note (Signed)
Minimal ALT elevation similar to prev.  No abd pain, no black stools or bloating.  Would still need atorvastatin.  Good exercise tolerance.  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

## 2022-09-07 NOTE — Assessment & Plan Note (Signed)
Advance directive- wife designated if patient were incapacitated.  

## 2022-09-07 NOTE — Assessment & Plan Note (Addendum)
Flu previously done Shingles done at pharmacy PNA done at pharmacy Tetanus deferred. COVID-vaccine previously done Colon 2016 Prostate cancer screening- he has some LUTS with occ starting and stopping.  Sx are not consistent/everyday.   Advance directive-wife designated if patient were incapacitated. Aorta f/u due in 2026.

## 2023-02-11 IMAGING — CT CT CARDIAC CORONARY ARTERY CALCIUM SCORE
3 series · 14 of 20 positions shown, 16 images · non-contrast
Comparison: No priors.
COMPARISON: No priors.

Addendum:
EXAM:
OVER-READ INTERPRETATION  CT CHEST

The following report is an over-read performed by radiologist Dr.
Sersh Rashidov [REDACTED] on 10/20/2020. This
over-read does not include interpretation of cardiac or coronary
anatomy or pathology. The coronary calcium score interpretation by
the cardiologist is attached.
CLINICAL DATA: Cardiovascular Disease Risk stratification
Coronary Calcium Score
TECHNIQUE: A gated, non-contrast computed tomography scan of the heart was
performed using 3mm slice thickness. Axial images were analyzed on a
dedicated workstation. Calcium scoring of the coronary arteries was
performed using the Agatston method.

[Series 2: cascseq 2.0 sa36 70% (id) · axial · 0.43mm/px · z∈[-242,-162]mm · 4 of 68 slices shown]
[im 14/68  vessel]
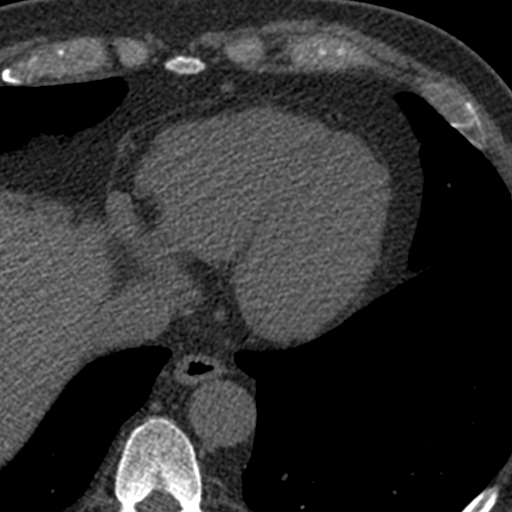
[im 27/68  vessel]
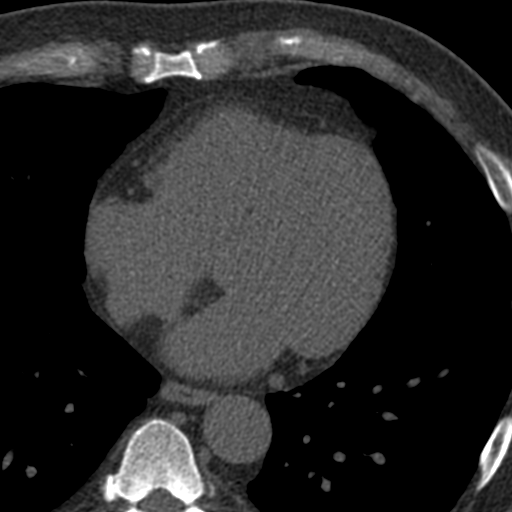
[im 41/68  vessel]
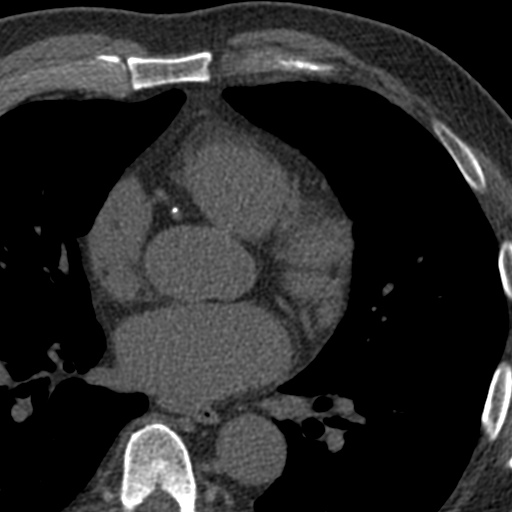
[im 54/68  vessel]
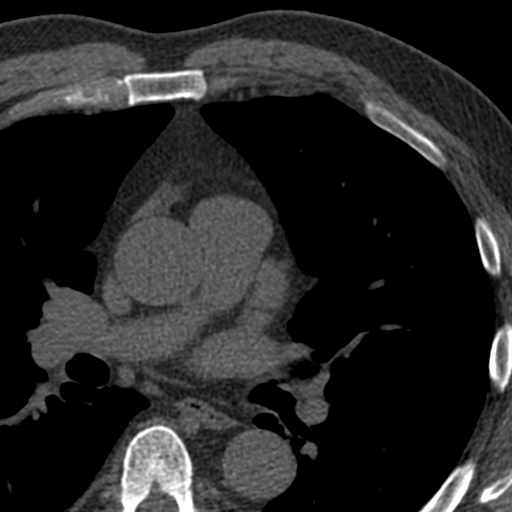

[Series 3: cascseq 2.0 bf37 st · axial · 0.73mm/px · z∈[-246,-158]mm · 5 of 68 slices shown, 7 images]
[im 12/68  vessel]
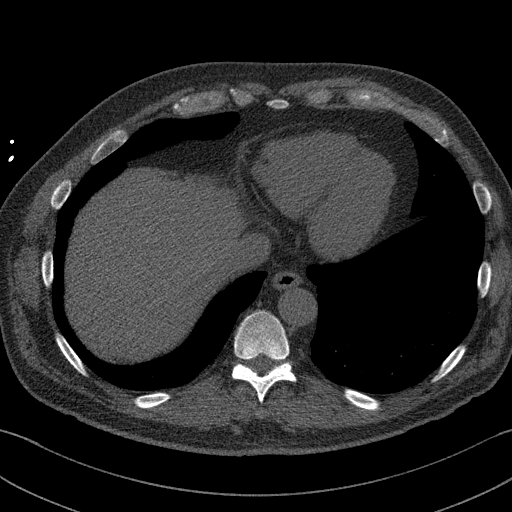
[im 12/68  lung]
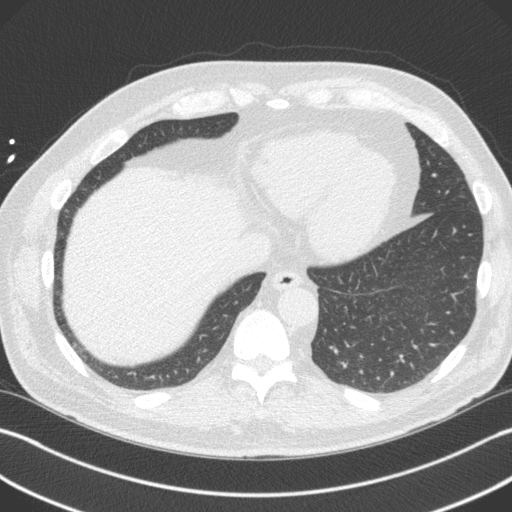
[im 23/68  vessel]
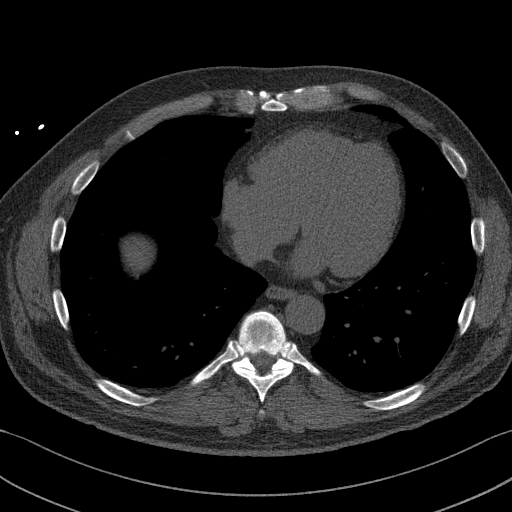
[im 34/68  vessel]
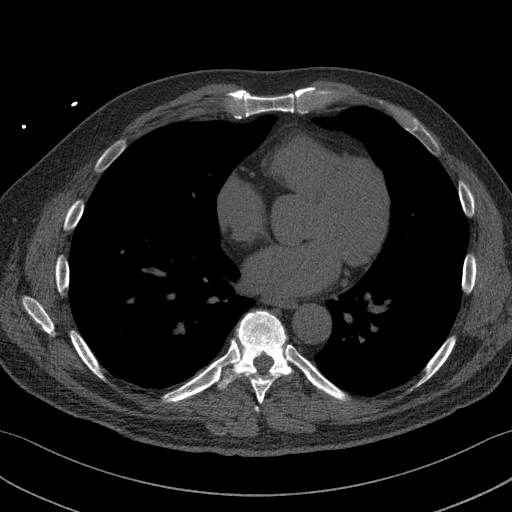
[im 45/68  vessel]
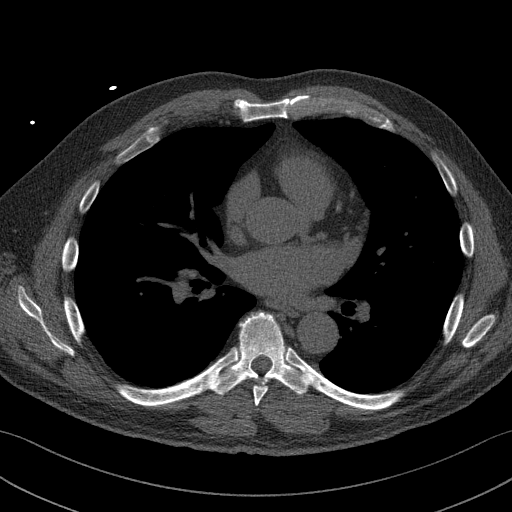
[im 56/68  vessel]
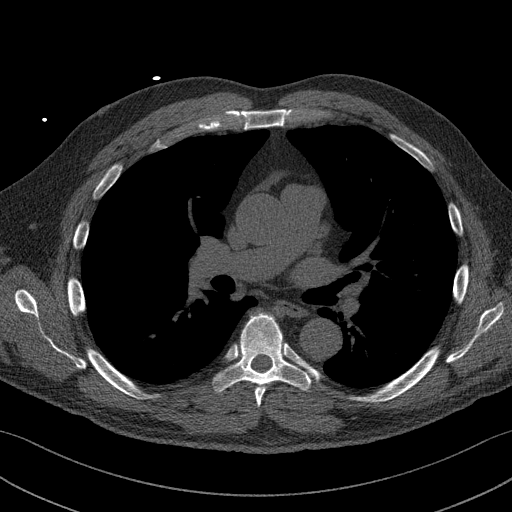
[im 56/68  lung]
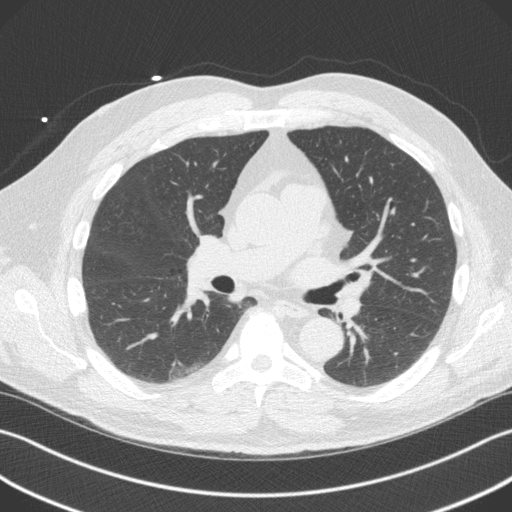

[Series 4: cascseq 2.0 br59 lung · axial · 0.73mm/px · z∈[-246,-158]mm · 5 of 68 slices shown]
[im 12/68  lung]
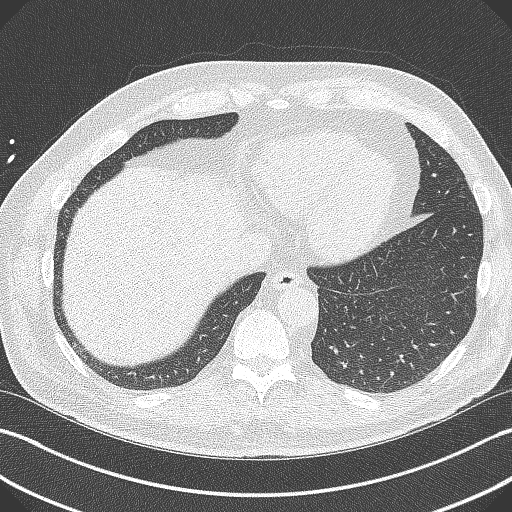
[im 23/68  lung]
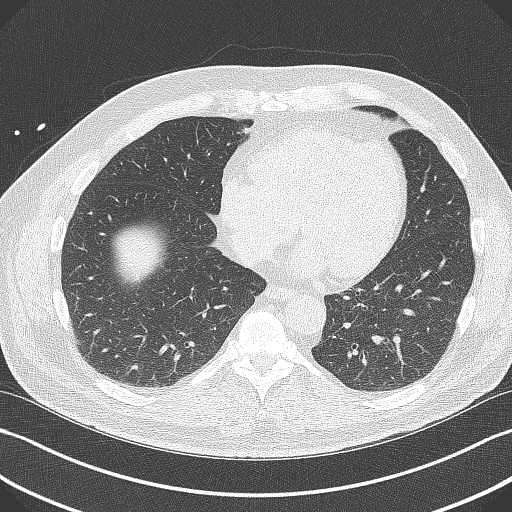
[im 34/68  lung]
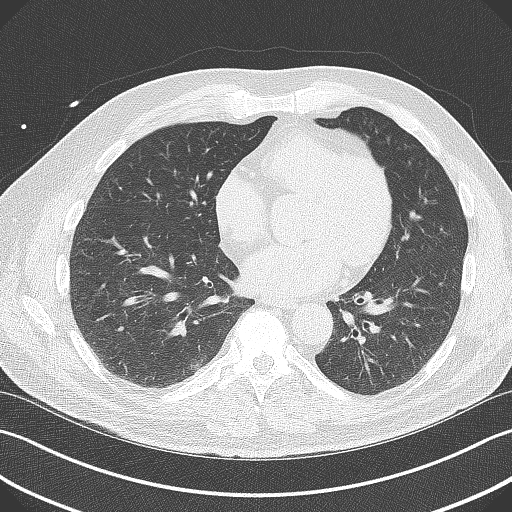
[im 45/68  lung]
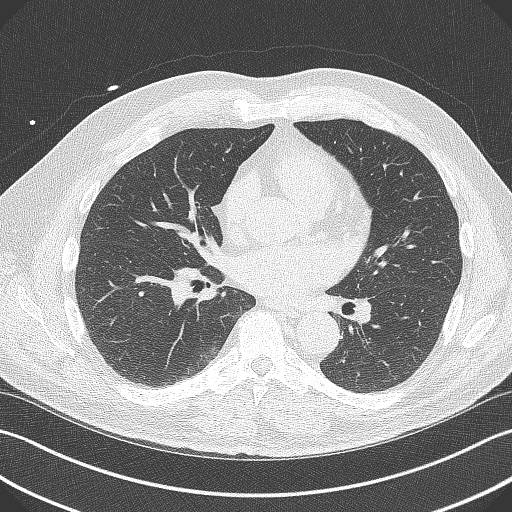
[im 56/68  lung]
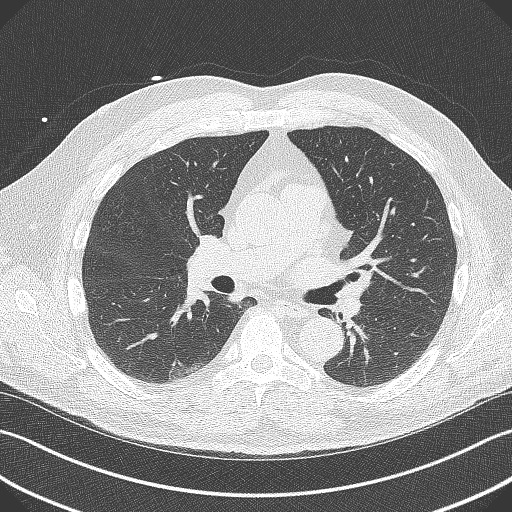

[14 of 20 positions shown; findings below may reference images not displayed]

FINDINGS: Within the visualized portions of the thorax there are no suspicious
appearing pulmonary nodules or masses, there is no acute
consolidative airspace disease, no pleural effusions, no
pneumothorax and no lymphadenopathy. Visualized portions of the
upper abdomen are unremarkable. There are no aggressive appearing
lytic or blastic lesions noted in the visualized portions of the
skeleton.
IMPRESSION: 1. No significant incidental noncardiac findings are noted.
FINDINGS: Coronary arteries: Normal origins.

Coronary Calcium Score:

Left main: 0

Left anterior descending artery: 118

Left circumflex artery: 49

Right coronary artery: 19

Total: 186

Percentile: 59

Pericardium: Normal.

Aorta: Normal caliber of ascending aorta. No aortic atherosclerosis
noted.

Non-cardiac: See separate report from [REDACTED].
IMPRESSION: Coronary calcium score of 186. This was 59th percentile for age-,
race-, and sex-matched controls.



If CAC=0, it is reasonable to withhold statin therapy and reassess
in 5 to 10 years, as long as higher risk conditions are absent
(diabetes mellitus, family history of premature CHD in first degree
relatives (males <55 years; females <65 years), cigarette smoking,
or LDL >=190 mg/dL).

If CAC is 1 to 99, it is reasonable to initiate statin therapy for
patients >=55 years of age.

If CAC is >=100 or >=75th percentile, it is reasonable to initiate
statin therapy at any age.

Cardiology referral should be considered for patients with CAC
scores >=400 or >=75th percentile.

*4515 AHA/ACC/AACVPR/AAPA/ABC/JIM/MOOLMAN/LYNDA/[place]/RIGEL/PA/PONDER
Guideline on the Management of Blood Cholesterol: A Report of the
American College of Cardiology/American Heart Association Task Force
on Clinical Practice Guidelines. J Am Coll Cardiol.
3112;73(24):8111-8392.

*** End of Addendum ***
EXAM:
OVER-READ INTERPRETATION  CT CHEST

The following report is an over-read performed by radiologist Dr.
Sersh Rashidov [REDACTED] on 10/20/2020. This
over-read does not include interpretation of cardiac or coronary
anatomy or pathology. The coronary calcium score interpretation by
the cardiologist is attached.
FINDINGS: Within the visualized portions of the thorax there are no suspicious
appearing pulmonary nodules or masses, there is no acute
consolidative airspace disease, no pleural effusions, no
pneumothorax and no lymphadenopathy. Visualized portions of the
upper abdomen are unremarkable. There are no aggressive appearing
lytic or blastic lesions noted in the visualized portions of the
skeleton.
IMPRESSION: 1. No significant incidental noncardiac findings are noted.

## 2023-07-19 ENCOUNTER — Other Ambulatory Visit: Payer: Self-pay | Admitting: Family Medicine

## 2023-07-19 DIAGNOSIS — E785 Hyperlipidemia, unspecified: Secondary | ICD-10-CM

## 2023-07-19 DIAGNOSIS — R7309 Other abnormal glucose: Secondary | ICD-10-CM

## 2023-07-19 DIAGNOSIS — Z125 Encounter for screening for malignant neoplasm of prostate: Secondary | ICD-10-CM

## 2023-08-01 ENCOUNTER — Other Ambulatory Visit (INDEPENDENT_AMBULATORY_CARE_PROVIDER_SITE_OTHER)

## 2023-08-01 DIAGNOSIS — Z125 Encounter for screening for malignant neoplasm of prostate: Secondary | ICD-10-CM

## 2023-08-01 DIAGNOSIS — E785 Hyperlipidemia, unspecified: Secondary | ICD-10-CM | POA: Diagnosis not present

## 2023-08-01 DIAGNOSIS — R7309 Other abnormal glucose: Secondary | ICD-10-CM

## 2023-08-01 LAB — COMPREHENSIVE METABOLIC PANEL WITH GFR
ALT: 32 U/L (ref 0–53)
AST: 22 U/L (ref 0–37)
Albumin: 4.5 g/dL (ref 3.5–5.2)
Alkaline Phosphatase: 73 U/L (ref 39–117)
BUN: 18 mg/dL (ref 6–23)
CO2: 30 meq/L (ref 19–32)
Calcium: 9.6 mg/dL (ref 8.4–10.5)
Chloride: 105 meq/L (ref 96–112)
Creatinine, Ser: 1.09 mg/dL (ref 0.40–1.50)
GFR: 68.55 mL/min (ref >60.00)
Glucose, Bld: 100 mg/dL — ABNORMAL HIGH (ref 70–99)
Potassium: 4.4 meq/L (ref 3.5–5.1)
Sodium: 142 meq/L (ref 135–145)
Total Bilirubin: 1.1 mg/dL (ref 0.2–1.2)
Total Protein: 6.4 g/dL (ref 6.0–8.3)

## 2023-08-01 LAB — LIPID PANEL
Cholesterol: 116 mg/dL (ref 0–200)
HDL: 36.9 mg/dL — ABNORMAL LOW (ref >39.00)
LDL Cholesterol: 48 mg/dL (ref 0–99)
NonHDL: 79.45
Total CHOL/HDL Ratio: 3
Triglycerides: 158 mg/dL — ABNORMAL HIGH (ref 0.0–149.0)
VLDL: 31.6 mg/dL (ref 0.0–40.0)

## 2023-08-01 LAB — PSA, MEDICARE: PSA: 0.93 ng/mL (ref 0.10–4.00)

## 2023-08-01 LAB — HEMOGLOBIN A1C: Hgb A1c MFr Bld: 5.3 % (ref 4.6–6.5)

## 2023-08-02 ENCOUNTER — Ambulatory Visit: Payer: Self-pay | Admitting: Family Medicine

## 2023-08-16 ENCOUNTER — Ambulatory Visit (INDEPENDENT_AMBULATORY_CARE_PROVIDER_SITE_OTHER): Payer: Medicare Other

## 2023-08-16 VITALS — Ht 71.0 in | Wt 203.0 lb

## 2023-08-16 DIAGNOSIS — Z0000 Encounter for general adult medical examination without abnormal findings: Secondary | ICD-10-CM | POA: Diagnosis not present

## 2023-08-16 NOTE — Progress Notes (Signed)
 Please attest and cosign this visit due to patients primary care provider not being in the office at the time the visit was completed.    Subjective:   Douglas Gaines is a 73 y.o. who presents for a Medicare Wellness preventive visit.  As a reminder, Annual Wellness Visits don't include a physical exam, and some assessments may be limited, especially if this visit is performed virtually. We may recommend an in-person follow-up visit with your provider if needed.  Visit Complete: Virtual I connected with  Douglas Gaines on 08/16/23 by a audio enabled telemedicine application and verified that I am speaking with the correct person using two identifiers.  Patient Location: Home  Provider Location: Home Office  I discussed the limitations of evaluation and management by telemedicine. The patient expressed understanding and agreed to proceed.  Vital Signs: Because this visit was a virtual/telehealth visit, some criteria may be missing or patient reported. Any vitals not documented were not able to be obtained and vitals that have been documented are patient reported.  VideoDeclined- This patient declined Librarian, academic. Therefore the visit was completed with audio only.  Persons Participating in Visit: Patient.  AWV Questionnaire: No: Patient Medicare AWV questionnaire was not completed prior to this visit.  Cardiac Risk Factors include: advanced age (>91men, >58 women);dyslipidemia;male gender     Objective:    Today's Vitals   08/16/23 1555  Weight: 203 lb (92.1 kg)  Height: 5' 11 (1.803 m)   Body mass index is 28.31 kg/m.     08/16/2023    4:03 PM 08/11/2022    2:07 PM  Advanced Directives  Does Patient Have a Medical Advance Directive? No No  Would patient like information on creating a medical advance directive?  No - Patient declined    Current Medications (verified) Outpatient Encounter Medications as of 08/16/2023  Medication Sig    atorvastatin  (LIPITOR) 10 MG tablet Take 1 tablet (10 mg total) by mouth daily.   Multiple Vitamins-Minerals (CENTRUM SILVER 50+MEN) TABS    niacinamide 500 MG tablet    Omega-3 Fatty Acids (FISH OIL) 500 MG CAPS Take by mouth.   sildenafil  (REVATIO ) 20 MG tablet Take 1 tablet (20 mg total) by mouth daily as needed.   No facility-administered encounter medications on file as of 08/16/2023.    Allergies (verified) Tetanus toxoid   History: Past Medical History:  Diagnosis Date   Melanoma (HCC) 2010   Plantar fasciitis 2011   left   Ureteral stone 05/98   IVP Distal Right   Past Surgical History:  Procedure Laterality Date   MELANOMA EXCISION  02/07/08   [facility]S Right Ant Flank with Neg Sentinel Nodes [facility]- followed by Dr. 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[employer]  Tobacco Use   Smoking status: Former    Current packs/day: 0.00    Types: Cigarettes    Quit date: 02/01/1988    Years since quitting: 35.5   Smokeless tobacco: Never  Substance and Sexual Activity   Alcohol use: No    Alcohol/week: 0.0 standard drinks of alcohol   Drug use: No   Sexual activity: Not on file  Other Topics Concern   Not on file  Social History Narrative   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[employer], prev coached at [employer]   Married 1978   3 kids, 1 is a family doctor, 1 is a Runner, broadcasting/film/video and 1 is a Engineer, civil (consulting)     6 grandkids as of 2024   Former IT sales professional    Social Drivers of Corporate investment banker Strain: Low Risk  (08/16/2023)   Overall Financial Resource Strain (CARDIA)    Difficulty of Paying Living Expenses: Not hard at all  Food Insecurity: No Food Insecurity (08/16/2023)   Hunger Vital Sign    Worried About Running Out of Food in the Last Year: Never true    Ran Out of Food in the Last Year: Never true  Transportation Needs: No Transportation Needs (08/16/2023)   PRAPARE - Administrator, Civil Service (Medical): No    Lack of Transportation (Non-Medical): No  Physical Activity: Sufficiently Active (08/16/2023)   Exercise Vital Sign    Days of Exercise per Week: 5 days    Minutes of Exercise per Session: 40 min  Stress: No Stress Concern Present (08/16/2023)   [employer] of Occupational Health - Occupational Stress Questionnaire    Feeling of Stress: Not at all  Social Connections: Socially Integrated (08/16/2023)   Social Connection and Isolation Panel    Frequency of Communication with Friends and Family: More than three times a week    Frequency of Social Gatherings with Friends and Family: More than three times a week    Attends Religious Services: More than 4 times per year    Active Member of [employer] or Organizations: Yes    Attends Engineer, structural: More than 4 times per year    Marital Status: Married    Tobacco Counseling Counseling given: Not Answered    Clinical Intake:  Pre-visit preparation completed: Yes  Pain : No/denies pain     BMI - recorded: 28.31 Nutritional Status: BMI 25 -29 Overweight Nutritional Risks: None Diabetes: No  Lab Results  Component Value Date   HGBA1C 5.3 08/01/2023   HGBA1C 5.2 08/30/2022   HGBA1C 5.3 08/06/2021     How often do you need to have someone help you when you read instructions, pamphlets, or other written materials from your doctor or pharmacy?: 1 - Never  Interpreter Needed?: No  Comments: lives with wife Information  entered by :: B.Ursula Dermody,LPN   Activities of Daily Living     08/16/2023    4:04 PM  In your present state of health, do you have any difficulty performing the following activities:  Hearing? 0  Vision? 0  Difficulty concentrating or making decisions? 0  Walking or climbing stairs? 0  Dressing or bathing? 0  Doing errands, shopping? 0  Preparing Food and eating ? N  Using the Toilet? N  In the past six months, have you accidently leaked urine? N  Do you have problems with loss of bowel control? N  Managing your Medications? N  Managing your Finances? N  Housekeeping  or managing your Housekeeping? N    Patient Care Team: Cleatus Arlyss RAMAN, MD as PCP - General (Family Medicine) Octavia, Charlie Hamilton, MD as Consulting Physician (Ophthalmology)  I have updated your Care Teams any recent Medical Services you may have received from other providers in the past year.     Assessment:   This is a routine wellness examination for Standard.  Hearing/Vision screen Hearing Screening - Comments:: Pt says his hearing is good Vision Screening - Comments:: Pt says his vision is good;readers only   Goals Addressed             This Visit's Progress    Patient Stated   On track    08/16/23-Continue to exercise and be active       Depression Screen     08/16/2023    4:01 PM 09/06/2022   12:46 PM 08/11/2022    2:06 PM 08/17/2020    9:44 AM 08/15/2019    9:43 AM 08/13/2018   12:16 PM 05/29/2017    3:10 PM  PHQ 2/9 Scores  PHQ - 2 Score 0 0 0 0 0 0 0  PHQ- 9 Score  0         Fall Risk     08/16/2023    3:58 PM 09/06/2022   12:46 PM 08/11/2022    2:04 PM 08/17/2020    9:44 AM 08/15/2019    9:43 AM  Fall Risk   Falls in the past year? 0 0 0 0 0  Number falls in past yr: 0 0 0 0 0  Injury with Fall? 0 0 0 0 0  Risk for fall due to : No Fall Risks No Fall Risks No Fall Risks    Follow up Education provided;Falls prevention discussed Falls evaluation completed Falls prevention  discussed;Falls evaluation completed Falls evaluation completed       Data saved with a previous flowsheet row definition    MEDICARE RISK AT HOME:  Medicare Risk at Home Any stairs in or around the home?: Yes If so, are there any without handrails?: Yes Home free of loose throw rugs in walkways, pet beds, electrical cords, etc?: Yes Adequate lighting in your home to reduce risk of falls?: Yes Life alert?: No Use of a cane, walker or w/c?: No Grab bars in the bathroom?: No Shower chair or bench in shower?: Yes Elevated toilet seat or a handicapped toilet?: Yes  TIMED UP AND GO:  Was the test performed?  No  Cognitive Function: 6CIT completed        08/16/2023    4:05 PM 08/11/2022    2:09 PM  6CIT Screen  What Year? 0 points 0 points  What month? 0 points 0 points  What time? 0 points 0 points  Count back from 20 0 points 0 points  Months in reverse 0 points 0 points  Repeat phrase 0 points 0 points  Total Score 0 points 0 points    Immunizations Immunization History  Administered Date(s) Administered   Influenza-Unspecified 10/31/2017, 10/27/2021   PFIZER(Purple Top)SARS-COV-2 Vaccination 03/09/2019, 04/03/2019, 10/28/2019   PNEUMOCOCCAL CONJUGATE-20 03/03/2021   Pfizer Covid-19 Vaccine Bivalent Booster 56yrs & up 08/21/2020, 01/18/2021   Pneumococcal Polysaccharide-23 08/13/2018   Td 02/01/1999   Zoster Recombinant(Shingrix) 03/03/2021, 05/01/2021   Zoster, Live 04/25/2013    Screening Tests Health Maintenance  Topic Date Due   DTaP/Tdap/Td (2 - Tdap) 01/31/2009   COVID-19 Vaccine (6 - 2024-25 season) 10/02/2022   INFLUENZA VACCINE  09/01/2023  Colonoscopy  02/27/2024   Medicare Annual Wellness (AWV)  08/15/2024   Pneumococcal Vaccine: 50+ Years  Completed   Hepatitis C Screening  Completed   Zoster Vaccines- Shingrix  Completed   Hepatitis B Vaccines  Aged Out   HPV VACCINES  Aged Out   Meningococcal B Vaccine  Aged Out    Health  Maintenance  Health Maintenance Due  Topic Date Due   DTaP/Tdap/Td (2 - Tdap) 01/31/2009   COVID-19 Vaccine (6 - 2024-25 season) 10/02/2022   Health Maintenance Items Addressed: None needed at this time  Additional Screening:  Vision Screening: Recommended annual ophthalmology exams for early detection of glaucoma and other disorders of the eye. Would you like a referral to an eye doctor? No    Dental Screening: Recommended annual dental exams for proper oral hygiene  Community Resource Referral / Chronic Care Management: CRR required this visit?  No   CCM required this visit?  No   Plan:    I have personally reviewed and noted the following in the patient's chart:   Medical and social history Use of alcohol, tobacco or illicit drugs  Current medications and supplements including opioid prescriptions. Patient is not currently taking opioid prescriptions. Functional ability and status Nutritional status Physical activity Advanced directives List of other physicians Hospitalizations, surgeries, and ER visits in previous 12 months Vitals Screenings to include cognitive, depression, and falls Referrals and appointments  In addition, I have reviewed and discussed with patient certain preventive protocols, quality metrics, and best practice recommendations. A written personalized care plan for preventive services as well as general preventive health recommendations were provided to patient.   Erminio LITTIE Saris, LPN   2/83/7974   After Visit Summary: (MyChart) Due to this being a telephonic visit, the after visit summary with patients personalized plan was offered to patient via MyChart   Notes: Nothing significant to report at this time.

## 2023-08-16 NOTE — Patient Instructions (Addendum)
 Douglas Gaines , Thank you for taking time out of your busy schedule to complete your Annual Wellness Visit with me. I enjoyed our conversation and look forward to speaking with you again next year. I, as well as your care team,  appreciate your ongoing commitment to your health goals. Please review the following plan we discussed and let me know if I can assist you in the future. Your Game plan/ To Do List     Follow up Visits: Next Medicare AWV with our clinical staff: 08/16/24 @ 11:30am televisit   Have you seen your provider in the last 6 months (3 months if uncontrolled diabetes)? No Next Office Visit with your provider: 09/07/23 physical  Clinician Recommendations:  Aim for 30 minutes of exercise or brisk walking, 6-8 glasses of water, and 5 servings of fruits and vegetables each day.       This is a list of the screening recommended for you and due dates:  Health Maintenance  Topic Date Due   DTaP/Tdap/Td vaccine (2 - Tdap) 01/31/2009   COVID-19 Vaccine (6 - 2024-25 season) 10/02/2022   Flu Shot  09/01/2023   Colon Cancer Screening  02/27/2024   Medicare Annual Wellness Visit  08/15/2024   Pneumococcal Vaccine for age over 30  Completed   Hepatitis C Screening  Completed   Zoster (Shingles) Vaccine  Completed   Hepatitis B Vaccine  Aged Out   HPV Vaccine  Aged Out   Meningitis B Vaccine  Aged Out    Advanced directives: (Declined) Advance directive discussed with you today. Even though you declined this today, please call our office should you change your mind, and we can give you the proper paperwork for you to fill out. Advance Care Planning is important because it:  [x]  Makes sure you receive the medical care that is consistent with your values, goals, and preferences  [x]  It provides guidance to your family and loved ones and reduces their decisional burden about whether or not they are making the right decisions based on your wishes.  Follow the link provided in your after  visit summary or read over the paperwork we have mailed to you to help you started getting your Advance Directives in place. If you need assistance in completing these, please reach out to us  so that we can help you!

## 2023-08-31 ENCOUNTER — Other Ambulatory Visit: Payer: Medicare Other

## 2023-09-07 ENCOUNTER — Ambulatory Visit (INDEPENDENT_AMBULATORY_CARE_PROVIDER_SITE_OTHER): Payer: Medicare Other | Admitting: Family Medicine

## 2023-09-07 ENCOUNTER — Encounter: Payer: Self-pay | Admitting: Family Medicine

## 2023-09-07 VITALS — BP 126/62 | HR 63 | Temp 97.7°F | Ht 69.76 in | Wt 198.2 lb

## 2023-09-07 DIAGNOSIS — N529 Male erectile dysfunction, unspecified: Secondary | ICD-10-CM | POA: Diagnosis not present

## 2023-09-07 DIAGNOSIS — Z7189 Other specified counseling: Secondary | ICD-10-CM

## 2023-09-07 DIAGNOSIS — I2510 Atherosclerotic heart disease of native coronary artery without angina pectoris: Secondary | ICD-10-CM

## 2023-09-07 DIAGNOSIS — Z0000 Encounter for general adult medical examination without abnormal findings: Secondary | ICD-10-CM | POA: Diagnosis not present

## 2023-09-07 MED ORDER — ATORVASTATIN CALCIUM 10 MG PO TABS: 10.0000 mg | ORAL_TABLET | Freq: Every day | ORAL | 3 refills | Status: AC

## 2023-09-07 MED ORDER — SILDENAFIL CITRATE 20 MG PO TABS: 20.0000 mg | ORAL_TABLET | Freq: Every day | ORAL | 12 refills | Status: AC | PRN

## 2023-09-07 NOTE — Patient Instructions (Signed)
Take care.  Glad to see you.  Update me as needed.  I would get a flu shot each fall.

## 2023-09-07 NOTE — Progress Notes (Signed)
 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    No CP.  Not SOB.  He able to run at football practice w/o CP.   No ADE on statin.   Muscle aches: no Diet compliance:yes Exercise: yes Labs d/w pt.   Intentional weight loss, he cut out snacks.    History of ED.  No ADE on sildenafil , it worked.  Used prn.  No nitroglycerin use.  Minimal B tinnitus, not pulsatile.  Not bothersome to patient.  Noise exposure cautions discussed with patient.  He can update me as needed.  He is still seeing dermatology.    Flu previously done Shingles done at pharmacy PNA done at pharmacy Tetanus deferred. COVID-vaccine previously done Colon 2016 Prostate cancer screening- PSA wnl 2025.  Advance directive-wife designated if patient were incapacitated. Aorta f/u due in 2026.   PMH and SH reviewed  Meds, vitals, and allergies reviewed.   ROS: Per HPI unless specifically indicated in ROS section   GEN: nad, alert and oriented HEENT: mucous membranes moist NECK: supple w/o LA CV: rrr. PULM: ctab, no inc wob ABD: soft, +bs EXT: no edema SKIN: Well-perfused

## 2023-09-10 NOTE — Assessment & Plan Note (Signed)
 Advance directive-wife designated if patient were incapacitated. He would want his children involved in decisions.

## 2023-09-10 NOTE — Assessment & Plan Note (Signed)
 History of, doing well. He can run at football practice without chest pain. Labs d/w pt.   Intentional weight loss, he cut out snacks.   Continue atorvastatin .

## 2023-09-10 NOTE — Assessment & Plan Note (Signed)
 No ADE on sildenafil , it worked.  Used prn.  Continue as is.

## 2023-09-10 NOTE — Assessment & Plan Note (Signed)
  Flu previously done Shingles done at pharmacy PNA done at pharmacy Tetanus deferred. COVID-vaccine previously done Colon 2016 Prostate cancer screening- PSA wnl 2025.  Advance directive-wife designated if patient were incapacitated. Aorta f/u due in 2026.

## 2024-02-22 ENCOUNTER — Ambulatory Visit: Payer: Self-pay | Admitting: Podiatry

## 2024-02-23 ENCOUNTER — Encounter: Payer: Self-pay | Admitting: Podiatry

## 2024-02-23 ENCOUNTER — Ambulatory Visit: Admitting: Podiatry

## 2024-02-23 DIAGNOSIS — L600 Ingrowing nail: Secondary | ICD-10-CM

## 2024-02-23 NOTE — Patient Instructions (Signed)

## 2024-02-28 NOTE — Progress Notes (Signed)
 Subjective:   Patient ID: Douglas Gaines, male   DOB: 74 y.o.   MRN: [id number]   HPI Patient presents with a painful ingrown toenail left big toe that is incurvated in the corner and making it hard to be comfortable.  Has tried to trim and soak it without relief of symptoms and patient has not noted drainage.  Patient does not smoke likes to be active and from   Review of Systems  All other systems reviewed and are negative.       Objective:  Physical Exam Vitals and nursing note reviewed.  Constitutional:      Appearance: He is well-developed.  Pulmonary:     Effort: Pulmonary effort is normal.  Musculoskeletal:        General: Normal range of motion.  Skin:    General: Skin is warm.  Neurological:     Mental Status: He is alert.     Neurovascular status intact muscle strength found to be adequate range of motion within normal limits with incurvated left hallux medial border painful when pressed no active drainage mild redness noted.  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  I allowed patient to read consent form going over alternative treatments complications patient wants surgery on this and today infiltrated the left big toe 60 mg like Marcaine mixture sterile prep done using sterile instrumentation remove the border exposed matrix applied phenol 3 applications 30 seconds followed by alcohol lavage sterile dressing gave instructions on soaks wear dressing 24 hours take it off earlier if throbbing were to occur and encouraged to call questions concerns which may arise

## 2024-03-03 ENCOUNTER — Encounter: Payer: Self-pay | Admitting: Podiatry

## 2024-08-05 ENCOUNTER — Other Ambulatory Visit

## 2024-08-09 ENCOUNTER — Encounter: Admitting: Family Medicine

## 2024-08-16 ENCOUNTER — Ambulatory Visit

## 2024-09-09 ENCOUNTER — Encounter: Admitting: Family Medicine
